# Patient Record
Sex: Male | Born: 1985 | Race: Black or African American | Hispanic: No | Smoking: Never smoker
Health system: Southern US, Community
[De-identification: ages and names within clinical notes are randomized; demographics above are authoritative.]

## PROBLEM LIST (undated history)

## (undated) DIAGNOSIS — J339 Nasal polyp, unspecified: Secondary | ICD-10-CM

## (undated) HISTORY — PX: NASAL POLYP EXCISION: SHX2068

---

## 2018-02-02 ENCOUNTER — Encounter (HOSPITAL_COMMUNITY): Payer: Self-pay | Admitting: Emergency Medicine

## 2018-02-02 ENCOUNTER — Emergency Department (HOSPITAL_COMMUNITY)
Admission: EM | Admit: 2018-02-02 | Discharge: 2018-02-02 | Disposition: A | Discharge status: Home / Self Care | Payer: Self-pay | Attending: Emergency Medicine | Admitting: Emergency Medicine

## 2018-02-02 ENCOUNTER — Other Ambulatory Visit: Payer: Self-pay

## 2018-02-02 ENCOUNTER — Emergency Department (HOSPITAL_COMMUNITY): Payer: Self-pay

## 2018-02-02 DIAGNOSIS — J9801 Acute bronchospasm: Secondary | ICD-10-CM | POA: Insufficient documentation

## 2018-02-02 HISTORY — DX: Nasal polyp, unspecified: J33.9

## 2018-02-02 MED ORDER — ALBUTEROL SULFATE (2.5 MG/3ML) 0.083% IN NEBU
5.0000 mg | INHALATION_SOLUTION | Freq: Once | RESPIRATORY_TRACT | Status: AC
  Administered 2018-02-02: 5 mg via RESPIRATORY_TRACT
  Filled 2018-02-02: qty 6

## 2018-02-02 MED ORDER — ALBUTEROL SULFATE HFA 108 (90 BASE) MCG/ACT IN AERS
2.0000 | INHALATION_SPRAY | Freq: Once | RESPIRATORY_TRACT | Status: AC
  Administered 2018-02-02: 2 via RESPIRATORY_TRACT
  Filled 2018-02-02: qty 6.7

## 2018-02-02 NOTE — ED Triage Notes (Signed)
Pt comes in complaining of chest pressure and wheezing. Patient states it has been going on for the last two weeks with no improvement. Patient states tonight after work it got worse so he came to be seen. Patient does not complain of pain, but mid chest pressure.

## 2018-02-02 NOTE — ED Provider Notes (Signed)
TIME SEEN: 5:41 AM  CHIEF COMPLAINT: Chest tightness, shortness of breath, intermittent wheezing  HPI: Patient is a 32 year old male with history of obesity and childhood asthma who presents to the emergency department with 2 weeks of intermittent chest tightness, shortness of breath and wheezing.  No aggravating or relieving factors.  He does not have a nebulizer machine or inhalers at home.  No fever or cough.   No history of PE, DVT, exogenous estrogen use, recent fractures, surgery, trauma, hospitalization or prolonged travel. No lower extremity swelling or pain. No calf tenderness.  No history of coronary artery disease.  ROS: See HPI Constitutional: no fever  Eyes: no drainage  ENT: no runny nose   Cardiovascular:   chest pain  Resp:  SOB  GI: no vomiting GU: no dysuria Integumentary: no rash  Allergy: no hives  Musculoskeletal: no leg swelling  Neurological: no slurred speech ROS otherwise negative  PAST MEDICAL HISTORY/PAST SURGICAL HISTORY:  No past medical history on file.  MEDICATIONS:  Prior to Admission medications   Not on File    ALLERGIES:  No Known Allergies  SOCIAL HISTORY:  Social History   Tobacco Use  . Smoking status: Not on file  Substance Use Topics  . Alcohol use: Not on file    FAMILY HISTORY: No family history on file.  EXAM: BP 129/75 (BP Location: Left Arm)   Pulse 88   Temp 98.6 F (37 C) (Oral)   Resp 16   Ht 6\' 4"  (1.93 m)   Wt (!) 168.3 kg (371 lb)   SpO2 100%   BMI 45.16 kg/m  CONSTITUTIONAL: Alert and oriented and responds appropriately to questions. Well-appearing; well-nourished HEAD: Normocephalic EYES: Conjunctivae clear, pupils appear equal, EOMI ENT: normal nose; moist mucous membranes NECK: Supple, no meningismus, no nuchal rigidity, no LAD  CARD: RRR; S1 and S2 appreciated; no murmurs, no clicks, no rubs, no gallops RESP: Normal chest excursion without splinting or tachypnea; breath sounds clear and equal  bilaterally; no wheezes, no rhonchi, no rales, no hypoxia or respiratory distress, speaking full sentences ABD/GI: Normal bowel sounds; non-distended; soft, non-tender, no rebound, no guarding, no peritoneal signs, no hepatosplenomegaly BACK:  The back appears normal and is non-tender to palpation, there is no CVA tenderness EXT: Normal ROM in all joints; non-tender to palpation; no edema; normal capillary refill; no cyanosis, no calf tenderness or swelling    SKIN: Normal color for age and race; warm; no rash NEURO: Moves all extremities equally PSYCH: The patient's mood and manner are appropriate. Grooming and personal hygiene are appropriate.  MEDICAL DECISION MAKING: Patient here with atypical chest pain.  Doubt ACS, PE, dissection.  He has no risk factors for pulmonary embolus.  EKG shows no ischemic abnormality.  Suspect this could be bronchospasm.  He is not currently wheezing.  Will treat with albuterol and reassess.  Will obtain chest x-ray.  ED PROGRESS: Chest x-ray clear.  No pneumonia or volume overload.  No pneumothorax.  Patient reports symptoms completely resolved after albuterol.  Will discharge with inhaler.  Again suspect bronchospasm.  I do not feel he needs antibiotics or steroids at this time.  Will give outpatient follow-up.  Discussed return precautions.  At this time, I do not feel there is any life-threatening condition present. I have reviewed and discussed all results (EKG, imaging, lab, urine as appropriate) and exam findings with patient/family. I have reviewed nursing notes and appropriate previous records.  I feel the patient is safe to be discharged  home without further emergent workup and can continue workup as an outpatient as needed. Discussed usual and customary return precautions. Patient/family verbalize understanding and are comfortable with this plan.  Outpatient follow-up has been provided if needed. All questions have been answered.    EKG  Interpretation  Date/Time:  Saturday February 02 2018 05:38:16 EDT Ventricular Rate:  88 PR Interval:    QRS Duration: 93 QT Interval:  352 QTC Calculation: 426 R Axis:   14 Text Interpretation:  Sinus rhythm Low voltage, precordial leads No old tracing to compare Confirmed by Deanda Ruddell, Baxter HireKristen [phone]([id number]) on 02/02/2018 5:42:05 AM         Milina Pagett, Layla MawKristen N, DO 02/02/18 [phone]

## 2018-02-02 NOTE — Discharge Instructions (Signed)
You may use your albuterol 2-4 puffs every 2-4 hours as needed for shortness of breath and wheezing.   To find a primary care or specialty doctor please call [phone] or [phone] to access "[facility] Find a Doctor Service."  You may also go on the [facility] website at [email or web address]  There are also multiple [facility], [facility], [facility] and [facility] practices throughout the [place] that are frequently accepting new patients. You may find a clinic that is close to your home and contact them.  [facility] -  [address] [place] [id number] [phone]   [facility] -  [address] [place] [address] [phone]   [facility] [phone]  [place] [phone]

## 2018-02-03 ENCOUNTER — Emergency Department (HOSPITAL_COMMUNITY)
Admission: EM | Admit: 2018-02-03 | Discharge: 2018-02-03 | Disposition: A | Discharge status: Home / Self Care | Payer: Self-pay | Attending: Emergency Medicine | Admitting: Emergency Medicine

## 2018-02-03 ENCOUNTER — Emergency Department (HOSPITAL_COMMUNITY): Payer: Self-pay

## 2018-02-03 ENCOUNTER — Encounter (HOSPITAL_COMMUNITY): Payer: Self-pay | Admitting: Emergency Medicine

## 2018-02-03 DIAGNOSIS — R079 Chest pain, unspecified: Secondary | ICD-10-CM | POA: Insufficient documentation

## 2018-02-03 DIAGNOSIS — I309 Acute pericarditis, unspecified: Secondary | ICD-10-CM | POA: Insufficient documentation

## 2018-02-03 LAB — BASIC METABOLIC PANEL
Anion gap: 8 (ref 5–15)
BUN: 12 mg/dL (ref 6–20)
CO2: 27 mmol/L (ref 22–32)
CREATININE: 0.99 mg/dL (ref 0.61–1.24)
Calcium: 9.1 mg/dL (ref 8.9–10.3)
Chloride: 103 mmol/L (ref 101–111)
GFR calc non Af Amer: >60 mL/min (ref >60)
Glucose, Bld: 211 mg/dL — ABNORMAL HIGH (ref 65–99)
Potassium: 3.5 mmol/L (ref 3.5–5.1)
Sodium: 138 mmol/L (ref 135–145)

## 2018-02-03 LAB — TROPONIN I

## 2018-02-03 LAB — CBC
HEMATOCRIT: 42.8 % (ref 39.0–52.0)
Hemoglobin: 14.2 g/dL (ref 13.0–17.0)
MCH: 28.4 pg (ref 26.0–34.0)
MCHC: 33.2 g/dL (ref 30.0–36.0)
MCV: 85.6 fL (ref 78.0–100.0)
Platelets: 346 10*3/uL (ref 150–400)
RBC: 5 MIL/uL (ref 4.22–5.81)
RDW: 13.5 % (ref 11.5–15.5)
WBC: 7.2 10*3/uL (ref 4.0–10.5)

## 2018-02-03 LAB — D-DIMER, QUANTITATIVE: D-Dimer, Quant: <0.27 ug/mL-FEU (ref 0.00–0.50)

## 2018-02-03 LAB — CBG MONITORING, ED: Glucose-Capillary: 205 mg/dL — ABNORMAL HIGH (ref 65–99)

## 2018-02-03 LAB — I-STAT TROPONIN, ED: TROPONIN I, POC: 0 ng/mL (ref 0.00–0.08)

## 2018-02-03 MED ORDER — ASPIRIN 81 MG PO CHEW
324.0000 mg | CHEWABLE_TABLET | Freq: Once | ORAL | Status: AC
  Administered 2018-02-03: 324 mg via ORAL
  Filled 2018-02-03: qty 4

## 2018-02-03 MED ORDER — NITROGLYCERIN 0.4 MG SL SUBL
0.4000 mg | SUBLINGUAL_TABLET | SUBLINGUAL | Status: DC | PRN
  Administered 2018-02-03 (×2): 0.4 mg via SUBLINGUAL
  Filled 2018-02-03 (×2): qty 1

## 2018-02-03 MED ORDER — SODIUM CHLORIDE 0.9 % IV BOLUS
500.0000 mL | Freq: Once | INTRAVENOUS | Status: AC
  Administered 2018-02-03: 500 mL via INTRAVENOUS

## 2018-02-03 MED ORDER — COLCHICINE 0.6 MG PO TABS: 0.6000 mg | ORAL_TABLET | Freq: Every day | ORAL | 0 refills | Status: DC

## 2018-02-03 NOTE — Discharge Instructions (Addendum)
Please take ibuprofen 600 mg three times per day Please follow up with your primary doctor, if you do not have a primary care doctor , please call the number on the discharge instructions and find a primary care doctor.

## 2018-02-03 NOTE — ED Provider Notes (Signed)
[facility]-EMERGENCY DEPT Provider Note   CSN: [id number] Arrival date & time: 02/03/18  1148     History   Chief Complaint Chief Complaint  Patient presents with  . Shortness of Breath  . Chest Pain    HPI Itai Barbian is a 32 y.o. male.  HPI  32 y.o male chest pain front x 1 month worse for past two weeks and constant, pain is sharp, currently 8/10 and has tried mucinex, gas relief, and ibuprofen without relief.  Patient has coughed up clear sputum.  No fever, some chills, nausea, vomiting or diarrhea.  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  Denies leg swelling, but states some feet swelling.  Using albuterol without relief.    Past Medical History:  Diagnosis Date  . Nasal polyp     There are no active problems to display for this patient.   Past Surgical History:  Procedure Laterality Date  . NASAL POLYP EXCISION          Home Medications    Prior to Admission medications   Not on File    Family History History reviewed. No pertinent family history.  Social History Social History   Tobacco Use  . Smoking status: Never Smoker  . Smokeless tobacco: Never Used  Substance Use Topics  . Alcohol use: Yes    Comment: Occasional   . Drug use: Not Currently     Allergies   Patient has no known allergies.   Review of Systems Review of Systems  Constitutional: Positive for chills. Negative for fever.  HENT: Positive for sinus pressure. Negative for congestion, rhinorrhea, sinus pain, sneezing and sore throat.   Eyes: Positive for pain.  Respiratory: Positive for cough and shortness of breath.   Cardiovascular: Positive for chest pain and leg swelling.  Gastrointestinal: Negative.   Endocrine: Negative.   Genitourinary: Negative.   Musculoskeletal: Negative.   Skin: Negative.   Neurological: Negative.   Psychiatric/Behavioral: Negative.   All other systems reviewed and are negative.    Physical  Exam Updated Vital Signs BP 117/69 (BP Location: Left Arm)   Pulse (!) 105   Temp 98.6 F (37 C) (Oral)   Resp (!) 21   Ht 1.93 m (6\' 4" )   Wt (!) 168.3 kg (371 lb)   SpO2 97%   BMI 45.16 kg/m   Physical Exam  Constitutional: He is oriented to person, place, and time. He appears well-developed and well-nourished.  Morbidly obese  HENT:  Head: Normocephalic and atraumatic.  Mouth/Throat: Oropharynx is clear and moist.  Eyes: Pupils are equal, round, and reactive to light. EOM are normal.  Neck: Normal range of motion. Neck supple.  Cardiovascular: Tachycardia present.  Pulmonary/Chest: Effort normal and breath sounds normal.  Abdominal: Soft. Normal appearance.  Musculoskeletal: Normal range of motion.  Neurological: He is alert and oriented to person, place, and time.  Skin: Skin is warm and dry.  Psychiatric: His speech is normal and behavior is normal. Judgment and thought content normal. Cognition and memory are normal.  Nursing note and vitals reviewed.    ED Treatments / Results  Labs (all labs ordered are listed, but only abnormal results are displayed) Labs Reviewed - No data to display  EKG EKG Interpretation  Date/Time:  Sunday February 03 2018 12:56:38 EDT Ventricular Rate:  105 PR Interval:    QRS Duration: 91 QT Interval:  331 QTC Calculation: 438 R Axis:   17 Text Interpretation:  Sinus tachycardia ST  elev, probable normal early repol pattern st elevation v2 and v3 increased from yesterday Confirmed by Margarita Grizzleay, Jomaira Darr [phone]([id number]) on 02/03/2018 1:46:28 PM   Radiology Dg Chest 2 View  Result Date: 02/02/2018 CLINICAL DATA:  32 y/o  M; chest pressure and wheezing. EXAM: CHEST - 2 VIEW COMPARISON:  None. FINDINGS: The heart size and mediastinal contours are within normal limits. Both lungs are clear. The visualized skeletal structures are unremarkable. IMPRESSION: No active cardiopulmonary disease. Electronically Signed   By: Mitzi HansenLance  Furusawa-Stratton M.D.   On:  02/02/2018 06:33    Procedures Procedures (including critical care time)  Medications Ordered in ED Medications - No data to display   Initial Impression / Assessment and Plan / ED Course  I have reviewed the triage vital signs and the nursing notes.  Pertinent labs & imaging results that were available during my care of the patient were reviewed by me and considered in my medical decision making (see chart for details).     32 year old male presents today with one-month history of chest pain which is worsened over the past 2 weeks.  He denies any fever or chills.  He does have some dyspnea.  On evaluation his lungs appear clear.  Differential diagnosis includes PE, but negative for lateralized swelling and d-dimer is negative.  Patient with diffuse ST elevation on EKG.  Discussed with Dr. Sharyn Lullharwani, on cardiology.  He feels EKG is most consistent with pericarditis.  Plan repeat troponin.  If continues negative, will discharge to home with treatment for pericarditis. Final Clinical Impressions(s) / ED Diagnoses   Final diagnoses:  Chest pain, unspecified type  Acute pericarditis, unspecified type    ED Discharge Orders        Ordered    colchicine 0.6 MG tablet  Daily     02/03/18 1434       Margarita Grizzleay, Eaton Folmar, MD 02/06/18 [phone]

## 2018-02-03 NOTE — ED Triage Notes (Signed)
Pt c/o chest pain and SOB. Was seen yesterday with Rx for albuterol inhaler which he states is not working. He is feeling much worse today.

## 2018-06-21 ENCOUNTER — Emergency Department (HOSPITAL_COMMUNITY): Payer: Self-pay

## 2018-06-21 ENCOUNTER — Other Ambulatory Visit: Payer: Self-pay

## 2018-06-21 ENCOUNTER — Encounter (HOSPITAL_COMMUNITY): Payer: Self-pay

## 2018-06-21 ENCOUNTER — Emergency Department (HOSPITAL_COMMUNITY)
Admission: EM | Admit: 2018-06-21 | Discharge: 2018-06-21 | Disposition: A | Discharge status: Home / Self Care | Payer: Self-pay | Attending: Emergency Medicine | Admitting: Emergency Medicine

## 2018-06-21 DIAGNOSIS — J40 Bronchitis, not specified as acute or chronic: Secondary | ICD-10-CM | POA: Insufficient documentation

## 2018-06-21 DIAGNOSIS — Z79899 Other long term (current) drug therapy: Secondary | ICD-10-CM | POA: Insufficient documentation

## 2018-06-21 MED ORDER — PREDNISONE 50 MG PO TABS: ORAL_TABLET | ORAL | 0 refills | Status: DC

## 2018-06-21 MED ORDER — AZITHROMYCIN 250 MG PO TABS: 250.0000 mg | ORAL_TABLET | Freq: Every day | ORAL | 0 refills | Status: DC

## 2018-06-21 MED ORDER — PROMETHAZINE-PHENYLEPHRINE 6.25-5 MG/5ML PO SYRP: 5.0000 mL | ORAL_SOLUTION | ORAL | 0 refills | Status: DC | PRN

## 2018-06-21 MED ORDER — ALBUTEROL SULFATE HFA 108 (90 BASE) MCG/ACT IN AERS
2.0000 | INHALATION_SPRAY | RESPIRATORY_TRACT | Status: DC | PRN
  Administered 2018-06-21: 2 via RESPIRATORY_TRACT
  Filled 2018-06-21: qty 6.7

## 2018-06-21 NOTE — ED Provider Notes (Signed)
[facility]-EMERGENC[facility] DEPT Provider Note   CSN: [id number] Arrival date & time: 06/21/18  1713     History   Chief Complaint Chief Complaint  Patient presents with  . Cough    HPI Scott Li is a 32 y.o. male who presents to the ED with a cough. Patient reports that the cough has been going on for about 4 weeks. 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 Patient has been taking OTC medications and Tessalon that he got here several weeks ago.   HPI  Past Medical History:  Diagnosis Date  . Nasal polyp     There are no active problems to display for this patient.   Past Surgical History:  Procedure Laterality Date  . NASAL POLYP EXCISION          Home Medications    Prior to Admission medications   Medication Sig Start Date End Date Taking? Authorizing Provider  azithromycin (ZITHROMAX) 250 MG tablet Take 1 tablet (250 mg total) by mouth daily. Take first 2 tablets together, then 1 every day until finished. 06/21/18   Janne NapoleonNeese, Hope M, NP  colchicine 0.6 MG tablet Take 1 tablet (0.6 mg total) by mouth daily. 02/03/18   Margarita Grizzleay, Danielle, MD  multivitamin (ONE-A-DAY MEN'S) TABS tablet Take 1 tablet by mouth daily.    [provider]  predniSONE (DELTASONE) 50 MG tablet Take one tablet PO daily 06/21/18   Kerrie BuffaloNeese, Hope M, NP  promethazine-phenylephrine (PROMETHAZINE VC) 6.25-5 MG/5ML SYRP Take 5 mLs by mouth every 4 (four) hours as needed for congestion. 06/21/18   Janne NapoleonNeese, Hope M, NP    Family History No family history on file.  Social History Social History   Tobacco Use  . Smoking status: Never Smoker  . Smokeless tobacco: Never Used  Substance Use Topics  . Alcohol use: Yes    Comment: Occasional   . Drug use: Not Currently     Allergies   Patient has no known allergies.   Review of Systems Review of Systems  HENT: Positive for congestion and sore  throat.   Respiratory: Positive for cough and wheezing.   All other systems reviewed and are negative.    Physical Exam Updated Vital Signs BP 138/87 (BP Location: Left Arm)   Pulse 84   Temp 99 F (37.2 C) (Oral)   Resp 18   Ht 6\' 4"  (1.93 m)   Wt (!) 150.7 kg   SpO2 96%   BMI 40.44 kg/m   Physical Exam  Constitutional: He appears well-developed and well-nourished. No distress.  HENT:  Head: Normocephalic and atraumatic.  Right Ear: Tympanic membrane normal.  Left Ear: Tympanic membrane normal.  Nose: Mucosal edema and rhinorrhea present.  Mouth/Throat: Mucous membranes are normal. Posterior oropharyngeal erythema present. No oropharyngeal exudate or posterior oropharyngeal edema.  Eyes: EOM are normal.  Neck: Neck supple.  Cardiovascular: Normal rate and regular rhythm.  Pulmonary/Chest: Effort normal. No respiratory distress. He has wheezes. He has no rales.  Occasional expiratory wheezing heard  Musculoskeletal: Normal range of motion.  Neurological: He is alert.  Skin: Skin is warm and dry.  Psychiatric: He has a normal mood and affect. His behavior is normal.  Nursing note and vitals reviewed.    ED Treatments / Results  Labs (all labs ordered are listed, but only abnormal results are displayed) Labs Reviewed - No data to display  Radiology  Dg Chest 2 View  Result Date: 06/21/2018 CLINICAL DATA:  Cough EXAM: CHEST - 2 VIEW COMPARISON:  February 03, 2018 FINDINGS: Lungs are clear. Heart size and pulmonary vascularity are normal. No adenopathy. No pneumothorax. No bone lesions. IMPRESSION: No edema or consolidation. Electronically Signed   By: Bretta BangWilliam  Woodruff III M.D.   On: 06/21/2018 18:27    Procedures Procedures (including critical care time)  Medications Ordered in ED Medications  albuterol (PROVENTIL HFA;VENTOLIN HFA) 108 (90 Base) MCG/ACT inhaler 2 puff (has no administration in time range)     Initial Impression / Assessment and Plan / ED Course    I have reviewed the triage vital signs and the nursing notes. Pt CXR negative for acute infiltrate. Patient's symptoms are consistent with bronchitis with bronchospasm. Since patient has had persistent symptoms and not responding will treat with z-pak, albuterol inhaler, cough medication and f/u given [facility]. Patient verbalizes understanding and is agreeable with plan. Pt is hemodynamically stable & in NAD prior to dc.  Final Clinical Impressions(s) / ED Diagnoses   Final diagnoses:  Bronchitis    ED Discharge Orders         Ordered    predniSONE (DELTASONE) 50 MG tablet     06/21/18 1857    promethazine-phenylephrine (PROMETHAZINE VC) 6.25-5 MG/5ML SYRP  Every 4 hours PRN     06/21/18 1857    azithromycin (ZITHROMAX) 250 MG tablet  Daily     06/21/18 1857           Kerrie Buffaloeese, Hope BradleyM, NP 06/21/18 1908    Sabas SousBero, Michael M, MD 06/22/18 [phone]

## 2018-06-21 NOTE — ED Triage Notes (Signed)
Pt is from home. Pt is AOx4 and Ambulatory. Pt chief complaint is cough that has been going on for about 4 weeks. 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

## 2018-06-21 NOTE — Discharge Instructions (Addendum)
Follow up with [facility]. Call to schedule an appointment. You can take Claritin for allergies. Use the inhaler 2 puffs every 4 hours as needed for cough and wheezing.

## 2018-11-09 ENCOUNTER — Emergency Department (HOSPITAL_COMMUNITY)
Admission: EM | Admit: 2018-11-09 | Discharge: 2018-11-09 | Disposition: A | Discharge status: Home / Self Care | Payer: 59 | Attending: Emergency Medicine | Admitting: Emergency Medicine

## 2018-11-09 ENCOUNTER — Encounter (HOSPITAL_COMMUNITY): Payer: Self-pay

## 2018-11-09 ENCOUNTER — Other Ambulatory Visit: Payer: Self-pay

## 2018-11-09 DIAGNOSIS — J209 Acute bronchitis, unspecified: Secondary | ICD-10-CM | POA: Diagnosis not present

## 2018-11-09 DIAGNOSIS — J029 Acute pharyngitis, unspecified: Secondary | ICD-10-CM | POA: Diagnosis present

## 2018-11-09 DIAGNOSIS — J40 Bronchitis, not specified as acute or chronic: Secondary | ICD-10-CM

## 2018-11-09 DIAGNOSIS — Z79899 Other long term (current) drug therapy: Secondary | ICD-10-CM | POA: Diagnosis not present

## 2018-11-09 LAB — INFLUENZA PANEL BY PCR (TYPE A & B)
Influenza A By PCR: NEGATIVE
Influenza B By PCR: NEGATIVE

## 2018-11-09 MED ORDER — PREDNISONE 10 MG PO TABS: 40.0000 mg | ORAL_TABLET | Freq: Every day | ORAL | 0 refills | Status: DC

## 2018-11-09 MED ORDER — BENZONATATE 100 MG PO CAPS: 100.0000 mg | ORAL_CAPSULE | Freq: Three times a day (TID) | ORAL | 0 refills | Status: DC

## 2018-11-09 MED ORDER — IPRATROPIUM-ALBUTEROL 0.5-2.5 (3) MG/3ML IN SOLN
3.0000 mL | Freq: Once | RESPIRATORY_TRACT | Status: AC
  Administered 2018-11-09: 3 mL via RESPIRATORY_TRACT
  Filled 2018-11-09: qty 3

## 2018-11-09 MED ORDER — ALBUTEROL SULFATE HFA 108 (90 BASE) MCG/ACT IN AERS: 1.0000 | INHALATION_SPRAY | Freq: Four times a day (QID) | RESPIRATORY_TRACT | 0 refills | Status: DC | PRN

## 2018-11-09 MED ORDER — AZITHROMYCIN 250 MG PO TABS: 250.0000 mg | ORAL_TABLET | Freq: Every day | ORAL | 0 refills | Status: DC

## 2018-11-09 NOTE — ED Notes (Signed)
Pt refused strept screen until provider saw him

## 2018-11-09 NOTE — Discharge Instructions (Addendum)
Take the medication as directed. Follow up with your doctor or return here as needed.  °

## 2018-11-09 NOTE — ED Provider Notes (Signed)
[facility]EM[facility]PT Provider Note   CSN: [id number] Arrival date & time: 11/09/18  1121     History   Chief Complaint Chief Complaint  Patient presents with  . Cough  . Sore Throat    HPI Scott Li is a 33 y.o. male who presents to the ED with c/o sore throat and URI symptoms. The symptoms started 2 days ago. Patient reports family has been in town and had similar symptoms. Patient states that after family left he woke with sweats and chills, cough and wheezing. Patient has had inhaler in the past but ran out.   The history is provided by the patient. No language interpreter was used.  Influenza  Presenting symptoms: cough, fatigue, fever, myalgias and sore throat   Presenting symptoms: no diarrhea, no headaches, no nausea, no shortness of breath and no vomiting   Onset quality:  Gradual Duration:  2 days Progression:  Worsening Chronicity:  New Relieved by:  Nothing Worsened by:  Nothing Associated symptoms: chills and nasal congestion   Associated symptoms: no decreased appetite, no decrease in physical activity, no ear pain, no neck stiffness and no syncope   Risk factors: sick contacts     Past Medical History:  Diagnosis Date  . Nasal polyp     There are no active problems to display for this patient.   Past Surgical History:  Procedure Laterality Date  . NASAL POLYP EXCISION          Home Medications    Prior to Admission medications   Medication Sig Start Date End Date Taking? Authorizing Provider  albuterol (PROVENTIL HFA;VENTOLIN HFA) 108 (90 Base) MCG/ACT inhaler Inhale 1-2 puffs into the lungs every 6 (six) hours as needed for wheezing or shortness of breath. 11/09/18   Janne NapoleonNeese, Hope M, NP  azithromycin (ZITHROMAX) 250 MG tablet Take 1 tablet (250 mg total) by mouth daily. Take first 2 tablets together, then 1 every day until finished. 11/09/18   Janne NapoleonNeese, Hope M, NP  benzonatate (TESSALON) 100 MG capsule Take 1 capsule (100 mg  total) by mouth every 8 (eight) hours. 11/09/18   Janne NapoleonNeese, Hope M, NP  colchicine 0.6 MG tablet Take 1 tablet (0.6 mg total) by mouth daily. 02/03/18   Margarita Grizzleay, Danielle, MD  multivitamin (ONE-A-DAY MEN'S) TABS tablet Take 1 tablet by mouth daily.    [provider]  predniSONE (DELTASONE) 10 MG tablet Take 4 tablets (40 mg total) by mouth daily with breakfast. 11/09/18   Janne NapoleonNeese, Hope M, NP  promethazine-phenylephrine (PROMETHAZINE VC) 6.25-5 MG/5ML SYRP Take 5 mLs by mouth every 4 (four) hours as needed for congestion. 06/21/18   Janne NapoleonNeese, Hope M, NP    Family History History reviewed. No pertinent family history.  Social History Social History   Tobacco Use  . Smoking status: Never Smoker  . Smokeless tobacco: Never Used  Substance Use Topics  . Alcohol use: Yes    Comment: Occasional   . Drug use: Not Currently     Allergies   Patient has no known allergies.   Review of Systems Review of Systems  Constitutional: Positive for chills, fatigue and fever. Negative for decreased appetite.  HENT: Positive for congestion and sore throat. Negative for ear pain, mouth sores and trouble swallowing.   Eyes: Negative for discharge, redness and itching.  Respiratory: Positive for cough and wheezing. Negative for shortness of breath.   Cardiovascular: Negative for chest pain.  Gastrointestinal: Negative for abdominal pain, diarrhea, nausea and vomiting.  Genitourinary: Negative for dysuria, frequency and urgency.  Musculoskeletal: Positive for myalgias. Negative for back pain and neck stiffness.  Skin: Negative for rash.  Neurological: Negative for headaches.  Hematological: Negative for adenopathy.  Psychiatric/Behavioral: Negative for confusion. The patient is not nervous/anxious.      Physical Exam Updated Vital Signs BP (!) 147/92 (BP Location: Left Arm)   Pulse 88   Temp 99.1 F (37.3 C) (Oral)   Resp 16   Ht 6\' 4"  (1.93 m)   Wt (!) 147.4 kg   SpO2 98%   BMI 39.56 kg/m    Physical Exam Vitals signs and nursing note reviewed.  Constitutional:      General: He is not in acute distress.    Appearance: He is well-developed.  HENT:     Head: Normocephalic and atraumatic.     Right Ear: Tympanic membrane is erythematous.     Left Ear: Tympanic membrane normal.     Nose: Congestion present.     Mouth/Throat:     Mouth: Mucous membranes are moist. No oral lesions.     Pharynx: Uvula midline. Posterior oropharyngeal erythema present. No oropharyngeal exudate.  Eyes:     Extraocular Movements:     Right eye: Normal extraocular motion.     Conjunctiva/sclera: Conjunctivae normal.     Pupils: Pupils are equal, round, and reactive to light.  Neck:     Musculoskeletal: Normal range of motion and neck supple.  Cardiovascular:     Rate and Rhythm: Normal rate and regular rhythm.  Pulmonary:     Effort: Pulmonary effort is normal. No respiratory distress.     Breath sounds: Decreased air movement present. No rales. Wheezes: occasional.  Abdominal:     Palpations: Abdomen is soft.     Tenderness: There is no abdominal tenderness.  Musculoskeletal: Normal range of motion.  Lymphadenopathy:     Cervical: No cervical adenopathy.  Skin:    General: Skin is warm and dry.  Neurological:     Mental Status: He is alert and oriented to person, place, and time.     Cranial Nerves: No cranial nerve deficit.  Psychiatric:        Mood and Affect: Mood normal.      ED Treatments / Results  Labs (all labs ordered are listed, but only abnormal results are displayed) Labs Reviewed  INFLUENZA PANEL BY PCR (TYPE A & B)    Radiology No results found.  Procedures Procedures (including critical care time)  Medications Ordered in ED Medications  ipratropium-albuterol (DUONEB) 0.5-2.5 (3) MG/3ML nebulizer solution 3 mL (3 mLs Nebulization Given 11/09/18 1359)     Initial Impression / Assessment and Plan / ED Course  I have reviewed the triage vital signs and the  nursing notes.. Patients symptoms are consistent with bronchitis. Pt will be discharged with Z-pak , albuterol, tessalon and prednisone.   Verbalizes understanding and is agreeable with plan. Pt is hemodynamically stable & in NAD prior to dc.  Final Clinical Impressions(s) / ED Diagnoses   Final diagnoses:  Bronchitis    ED Discharge Orders         Ordered    benzonatate (TESSALON) 100 MG capsule  Every 8 hours     11/09/18 1546    azithromycin (ZITHROMAX) 250 MG tablet  Daily     11/09/18 1546    predniSONE (DELTASONE) 10 MG tablet  Daily with breakfast     11/09/18 1546    albuterol (PROVENTIL HFA;VENTOLIN HFA) 108 (90  Base) MCG/ACT inhaler  Every 6 hours PRN     11/09/18 1546           Kerrie Buffalo [place], [place] 11/10/18 2150    Rolan Bucco, MD 11/19/18 [phone]

## 2018-11-09 NOTE — ED Triage Notes (Signed)
Pt reports that he has had a productive cough and "scratchy throat" for 2 days. Pt reports that family was just in and had similar symptoms.

## 2019-01-09 ENCOUNTER — Other Ambulatory Visit: Payer: Self-pay

## 2019-01-09 ENCOUNTER — Encounter: Payer: Self-pay | Admitting: Emergency Medicine

## 2019-01-09 ENCOUNTER — Emergency Department: Payer: 59

## 2019-01-09 ENCOUNTER — Emergency Department
Admission: EM | Admit: 2019-01-09 | Discharge: 2019-01-09 | Disposition: A | Discharge status: Home / Self Care | Payer: 59 | Attending: Emergency Medicine | Admitting: Emergency Medicine

## 2019-01-09 DIAGNOSIS — R0602 Shortness of breath: Secondary | ICD-10-CM | POA: Diagnosis present

## 2019-01-09 DIAGNOSIS — J40 Bronchitis, not specified as acute or chronic: Secondary | ICD-10-CM | POA: Insufficient documentation

## 2019-01-09 DIAGNOSIS — Z79899 Other long term (current) drug therapy: Secondary | ICD-10-CM | POA: Insufficient documentation

## 2019-01-09 DIAGNOSIS — R079 Chest pain, unspecified: Secondary | ICD-10-CM | POA: Insufficient documentation

## 2019-01-09 LAB — CBC WITH DIFFERENTIAL/PLATELET
Abs Immature Granulocytes: 0.02 10*3/uL (ref 0.00–0.07)
Basophils Absolute: 0.1 10*3/uL (ref 0.0–0.1)
Basophils Relative: 1 %
Eosinophils Absolute: 0.3 10*3/uL (ref 0.0–0.5)
Eosinophils Relative: 5 %
HCT: 44.2 % (ref 39.0–52.0)
Hemoglobin: 14.6 g/dL (ref 13.0–17.0)
Immature Granulocytes: 0 %
Lymphocytes Relative: 40 %
Lymphs Abs: 2.4 10*3/uL (ref 0.7–4.0)
MCH: 28.3 pg (ref 26.0–34.0)
MCHC: 33 g/dL (ref 30.0–36.0)
MCV: 85.7 fL (ref 80.0–100.0)
MONO ABS: 0.6 10*3/uL (ref 0.1–1.0)
Monocytes Relative: 10 %
Neutro Abs: 2.7 10*3/uL (ref 1.7–7.7)
Neutrophils Relative %: 44 %
Platelets: 342 10*3/uL (ref 150–400)
RBC: 5.16 MIL/uL (ref 4.22–5.81)
RDW: 13 % (ref 11.5–15.5)
WBC: 6 10*3/uL (ref 4.0–10.5)
nRBC: 0 % (ref 0.0–0.2)

## 2019-01-09 LAB — BASIC METABOLIC PANEL
Anion gap: 4 — ABNORMAL LOW (ref 5–15)
BUN: 10 mg/dL (ref 6–20)
CO2: 27 mmol/L (ref 22–32)
Calcium: 8.9 mg/dL (ref 8.9–10.3)
Chloride: 105 mmol/L (ref 98–111)
Creatinine, Ser: 0.85 mg/dL (ref 0.61–1.24)
GFR calc Af Amer: >60 mL/min (ref >60)
GLUCOSE: 116 mg/dL — AB (ref 70–99)
Potassium: 4.2 mmol/L (ref 3.5–5.1)
Sodium: 136 mmol/L (ref 135–145)

## 2019-01-09 LAB — TROPONIN I
Troponin I: <0.03 ng/mL (ref <0.03)
Troponin I: <0.03 ng/mL (ref <0.03)

## 2019-01-09 MED ORDER — PREDNISONE 20 MG PO TABS
60.0000 mg | ORAL_TABLET | Freq: Once | ORAL | Status: AC
  Administered 2019-01-09: 60 mg via ORAL
  Filled 2019-01-09: qty 3

## 2019-01-09 MED ORDER — IPRATROPIUM-ALBUTEROL 0.5-2.5 (3) MG/3ML IN SOLN
3.0000 mL | Freq: Once | RESPIRATORY_TRACT | Status: AC
  Administered 2019-01-09: 3 mL via RESPIRATORY_TRACT
  Filled 2019-01-09: qty 3

## 2019-01-09 MED ORDER — IPRATROPIUM-ALBUTEROL 0.5-2.5 (3) MG/3ML IN SOLN
3.0000 mL | Freq: Once | RESPIRATORY_TRACT | Status: AC
  Administered 2019-01-09: 3 mL via RESPIRATORY_TRACT
  Filled 2019-01-09: qty 6

## 2019-01-09 MED ORDER — ALBUTEROL SULFATE HFA 108 (90 BASE) MCG/ACT IN AERS: 2.0000 | INHALATION_SPRAY | Freq: Four times a day (QID) | RESPIRATORY_TRACT | 2 refills | Status: DC | PRN

## 2019-01-09 MED ORDER — PREDNISONE 20 MG PO TABS: 60.0000 mg | ORAL_TABLET | Freq: Every day | ORAL | 0 refills | Status: AC

## 2019-01-09 NOTE — ED Notes (Signed)
Pt ambulatory to restroom with steady gait noted  

## 2019-01-09 NOTE — ED Triage Notes (Signed)
Patient ambulatory to triage with steady gait, without difficulty or distress noted; pt st since last night having pain beneath left breast accomp by [facility]; denies hx of same

## 2019-01-09 NOTE — ED Notes (Signed)
Pt reports chest pain to the left side at the bottom of the ribs towards the flank. Pt describes it as feeling like a horse kicked him. Pt is in no acutes distress and is watching TV at this time.

## 2019-01-09 NOTE — ED Provider Notes (Signed)
[facility] Emergency Department Provider Note  ____________________________________________  Time seen: Approximately 5:35 AM  I have reviewed the triage vital signs and the nursing notes.   HISTORY  Chief Complaint Chest Pain   HPI Scott Li is a 33 y.o. male with a history of bronchitis and sinusitis who presents for evaluation of chest pain and shortness of breath.  Patient reports that his symptoms started this evening.  The shortness of breath is mild and constant.  The chest pain is sharp located in the left side of his chest, intermittent, lasting minutes at a time.  He has had 2 episodes this evening of the chest pain.  No dizziness, nausea, vomiting.  He has had a mild dry cough.  No fever or chills.  Patient is not a smoker.  Denies family history or personal history of heart attacks, PE or DVT, recent travel immobilization, leg pain or swelling, hemoptysis, or exogenous hormones.  Past Medical History:  Diagnosis Date  . Nasal polyp     Past Surgical History:  Procedure Laterality Date  . NASAL POLYP EXCISION      Prior to Admission medications   Medication Sig Start Date End Date Taking? Authorizing Provider  albuterol (PROVENTIL HFA;VENTOLIN HFA) 108 (90 Base) MCG/ACT inhaler Inhale 2 puffs into the lungs every 6 (six) hours as needed for wheezing or shortness of breath. 01/09/19   Nita Sickle, MD  azithromycin (ZITHROMAX) 250 MG tablet Take 1 tablet (250 mg total) by mouth daily. Take first 2 tablets together, then 1 every day until finished. 11/09/18   Janne Napoleon, NP  benzonatate (TESSALON) 100 MG capsule Take 1 capsule (100 mg total) by mouth every 8 (eight) hours. 11/09/18   Janne Napoleon, NP  colchicine 0.6 MG tablet Take 1 tablet (0.6 mg total) by mouth daily. 02/03/18   Margarita Grizzle, MD  multivitamin (ONE-A-DAY MEN'S) TABS tablet Take 1 tablet by mouth daily.    [provider]  predniSONE (DELTASONE) 20 MG tablet Take  3 tablets (60 mg total) by mouth daily for 4 days. 01/09/19 01/13/19  Nita Sickle, MD  promethazine-phenylephrine (PROMETHAZINE VC) 6.25-5 MG/5ML SYRP Take 5 mLs by mouth every 4 (four) hours as needed for congestion. 06/21/18   Janne Napoleon, NP    Allergies Patient has no known allergies.  No family history on file.  Social History Social History   Tobacco Use  . Smoking status: Never Smoker  . Smokeless tobacco: Never Used  Substance Use Topics  . Alcohol use: Yes    Comment: Occasional   . Drug use: Not Currently    Review of Systems  Constitutional: Negative for fever. Eyes: Negative for visual changes. ENT: Negative for sore throat. Neck: No neck pain  Cardiovascular: + chest pain. Respiratory: + shortness of breath, cough Gastrointestinal: Negative for abdominal pain, vomiting or diarrhea. Genitourinary: Negative for dysuria. Musculoskeletal: Negative for back pain. Skin: Negative for rash. Neurological: Negative for headaches, weakness or numbness. Psych: No SI or HI  ____________________________________________   PHYSICAL EXAM:  VITAL SIGNS: ED Triage Vitals  Enc Vitals Group     BP 01/09/19 0533 133/81     Pulse Rate 01/09/19 0533 77     Resp 01/09/19 0533 18     Temp 01/09/19 0533 (!) 97.1 F (36.2 C)     Temp Source 01/09/19 0533 Oral     SpO2 01/09/19 0533 97 %     Weight 01/09/19 0523 (!) 345 lb (156.5  kg)     Height 01/09/19 0523 6\' 4"  (1.93 m)     Head Circumference --      Peak Flow --      Pain Score 01/09/19 0523 8     Pain Loc --      Pain Edu? --      Excl. in GC? --     Constitutional: Alert and oriented. Well appearing and in no apparent distress. HEENT:      Head: Normocephalic and atraumatic.         Eyes: Conjunctivae are normal. Sclera is non-icteric.       Mouth/Throat: Mucous membranes are moist.       Neck: Supple with no signs of meningismus. Cardiovascular: Regular rate and rhythm. No murmurs, gallops, or rubs. 2+  symmetrical distal pulses are present in all extremities. No JVD. Palpation of the left chest wall reproduces the pain Respiratory: Normal respiratory effort. Lungs are clear to auscultation bilaterally with decreased air movement bilaterally. No wheezes, crackles, or rhonchi.  Gastrointestinal: Obese, non tender, and non distended with positive bowel sounds. No rebound or guarding. Musculoskeletal: Nontender with normal range of motion in all extremities. No edema, cyanosis, or erythema of extremities. Neurologic: Normal speech and language. Face is symmetric. Moving all extremities. No gross focal neurologic deficits are appreciated. Skin: Skin is warm, dry and intact. No rash noted. Psychiatric: Mood and affect are normal. Speech and behavior are normal.  ____________________________________________   LABS (all labs ordered are listed, but only abnormal results are displayed)  Labs Reviewed  BASIC METABOLIC PANEL - Abnormal; Notable for the following components:      Result Value   Glucose, Bld 116 (*)    Anion gap 4 (*)    All other components within normal limits  CBC WITH DIFFERENTIAL/PLATELET  TROPONIN I  TROPONIN I   ____________________________________________  EKG  ED ECG REPORT I, Nita Sickle, the attending physician, personally viewed and interpreted this ECG.  Normal sinus rhythm, rate of 75, normal intervals, normal axis, no ST elevations or depressions.  Normal EKG. ____________________________________________  RADIOLOGY  I have personally reviewed the images performed during this visit and I agree with the Radiologist's read.   Interpretation by Radiologist:  Dg Chest 2 View  Result Date: 01/09/2019 CLINICAL DATA:  Chest pain EXAM: CHEST - 2 VIEW COMPARISON:  06/21/2018 FINDINGS: The heart size and mediastinal contours are within normal limits. Both lungs are clear. The visualized skeletal structures are unremarkable. IMPRESSION: No active cardiopulmonary  disease. Electronically Signed   By: Deatra Robinson M.D.   On: 01/09/2019 06:01      ____________________________________________   PROCEDURES  Procedure(s) performed: None Procedures Critical Care performed:  None ____________________________________________   INITIAL IMPRESSION / ASSESSMENT AND PLAN / ED COURSE  33 y.o. male with a history of bronchitis and sinusitis who presents for evaluation of chest pain and shortness of breath.  Patient with short-lived episodes of sharp left-sided chest pain which resolved with no intervention.  Heart score of 1.  PERC negative.  Will further stratify patient with a troponin x2.  Will get chest x-ray to rule out pulmonary edema although patient looks euvolemic, pneumothorax, pneumonia.  No fever or chills therefore flu is less likely.  Patient has very diminished air movement bilaterally but is in no respiratory distress.  Will treat with duo nebs. Possibly bronchitis.   Clinical Course as of Jan 08 634  Thu Jan 09, 2019  [id number] Patient reports shortness of breath  is improved after 2 DuoNeb's.  Remains with normal work of breathing and normal sats.  First troponin is negative.  Labs are no acute findings.  Chest x-ray normal.  Second troponin is due at 9 AM.  As long as that is negative will discharge patient home on prednisone and albuterol for possible bronchitis.  Will refer patient to cardiologist for further evaluation.  Plan discussed with the oncoming physician at 7 AM   [CV]    Clinical Course User Index [CV] Don Perking [place], MD     As part of my medical decision making, I reviewed the following data within the electronic MEDICAL RECORD NUMBER Nursing notes reviewed and incorporated, Labs reviewed , EKG interpreted , Old EKG reviewed, Old chart reviewed, Radiograph reviewed , Notes from prior ED visits and [place] Controlled Substance Database    Pertinent labs & imaging results that were available during my care of the patient were reviewed  by me and considered in my medical decision making (see chart for details).    ____________________________________________   FINAL CLINICAL IMPRESSION(S) / ED DIAGNOSES  Final diagnoses:  Chest pain, unspecified type  Bronchitis      NEW MEDICATIONS STARTED DURING THIS VISIT:  ED Discharge Orders         Ordered    predniSONE (DELTASONE) 20 MG tablet  Daily     01/09/19 0634    albuterol (PROVENTIL HFA;VENTOLIN HFA) 108 (90 Base) MCG/ACT inhaler  Every 6 hours PRN     01/09/19 0634           Note:  This document was prepared using Dragon voice recognition software and may include unintentional dictation errors.    Nita Sickle, MD 01/09/19 [phone]

## 2019-01-09 NOTE — Discharge Instructions (Addendum)

## 2019-02-03 ENCOUNTER — Telehealth: Payer: Self-pay | Admitting: *Deleted

## 2019-02-03 NOTE — Telephone Encounter (Signed)
   Primary Cardiologist: Dr Jacques Navy   Pt contacted.  History and symptoms reviewed.  Pt will f/u with [facility] provider as scheduled- vitual visit.   Patient will sign up for  [facility] AND INSTRUCTION WILL BE  SENT PRIOR TO APPOINTMENT.   RN SENT/ LINK TO SIGN UP  Doreen Salvage, RN  02/03/2019 10:46 AM

## 2019-02-04 ENCOUNTER — Other Ambulatory Visit: Payer: Self-pay

## 2019-02-04 ENCOUNTER — Telehealth: Payer: Self-pay | Admitting: Internal Medicine

## 2019-02-04 NOTE — Telephone Encounter (Signed)
LMTCB to do pre-call for upcoming visit with Dr. Jacques Navy on 02/06/19.

## 2019-02-05 NOTE — Telephone Encounter (Signed)
Attempted to call pt again. No answer. Left vm to call back.

## 2019-02-06 ENCOUNTER — Telehealth (INDEPENDENT_AMBULATORY_CARE_PROVIDER_SITE_OTHER): Payer: 59 | Admitting: Internal Medicine

## 2019-02-06 ENCOUNTER — Other Ambulatory Visit: Payer: Self-pay

## 2019-02-06 ENCOUNTER — Encounter: Payer: Self-pay | Admitting: Internal Medicine

## 2019-02-06 DIAGNOSIS — R0789 Other chest pain: Secondary | ICD-10-CM

## 2019-02-10 ENCOUNTER — Telehealth: Payer: Self-pay | Admitting: Internal Medicine

## 2019-02-10 NOTE — Telephone Encounter (Signed)
LVM to call back to do visit pre-call for appt with Dr. Jacques Navy on 02/13/19 at 10:40 AM.

## 2019-02-13 ENCOUNTER — Telehealth: Payer: 59 | Admitting: Internal Medicine

## 2019-02-16 NOTE — Progress Notes (Signed)
Patient did not answer phone to begin telehealth visit. He was encouraged to reschedule.

## 2019-07-01 ENCOUNTER — Emergency Department: Payer: 59

## 2019-07-01 ENCOUNTER — Other Ambulatory Visit: Payer: Self-pay

## 2019-07-01 ENCOUNTER — Encounter: Payer: Self-pay | Admitting: Emergency Medicine

## 2019-07-01 ENCOUNTER — Emergency Department
Admission: EM | Admit: 2019-07-01 | Discharge: 2019-07-01 | Disposition: A | Discharge status: Home / Self Care | Payer: 59 | Attending: Emergency Medicine | Admitting: Emergency Medicine

## 2019-07-01 DIAGNOSIS — R002 Palpitations: Secondary | ICD-10-CM | POA: Diagnosis present

## 2019-07-01 DIAGNOSIS — F419 Anxiety disorder, unspecified: Secondary | ICD-10-CM | POA: Diagnosis not present

## 2019-07-01 DIAGNOSIS — R079 Chest pain, unspecified: Secondary | ICD-10-CM

## 2019-07-01 LAB — CBC
HCT: 44 % (ref 39.0–52.0)
Hemoglobin: 14 g/dL (ref 13.0–17.0)
MCH: 27.8 pg (ref 26.0–34.0)
MCHC: 31.8 g/dL (ref 30.0–36.0)
MCV: 87.3 fL (ref 80.0–100.0)
Platelets: 362 10*3/uL (ref 150–400)
RBC: 5.04 MIL/uL (ref 4.22–5.81)
RDW: 13 % (ref 11.5–15.5)
WBC: 8 10*3/uL (ref 4.0–10.5)
nRBC: 0 % (ref 0.0–0.2)

## 2019-07-01 LAB — BASIC METABOLIC PANEL
Anion gap: 10 (ref 5–15)
BUN: 9 mg/dL (ref 6–20)
CO2: 27 mmol/L (ref 22–32)
Calcium: 9.1 mg/dL (ref 8.9–10.3)
Chloride: 100 mmol/L (ref 98–111)
Creatinine, Ser: 0.92 mg/dL (ref 0.61–1.24)
GFR calc Af Amer: >60 mL/min (ref >60)
GFR calc non Af Amer: >60 mL/min (ref >60)
Glucose, Bld: 164 mg/dL — ABNORMAL HIGH (ref 70–99)
Potassium: 3.4 mmol/L — ABNORMAL LOW (ref 3.5–5.1)
Sodium: 137 mmol/L (ref 135–145)

## 2019-07-01 LAB — TROPONIN I (HIGH SENSITIVITY)
Troponin I (High Sensitivity): <2 ng/L (ref <18)
Troponin I (High Sensitivity): <2 ng/L (ref <18)

## 2019-07-01 MED ORDER — PROPRANOLOL HCL 20 MG PO TABS
20.0000 mg | ORAL_TABLET | Freq: Once | ORAL | Status: AC
  Administered 2019-07-01: 05:00:00 20 mg via ORAL
  Filled 2019-07-01: qty 1

## 2019-07-01 MED ORDER — PROPRANOLOL HCL 20 MG PO TABS: 20.0000 mg | ORAL_TABLET | Freq: Three times a day (TID) | ORAL | 0 refills | Status: DC

## 2019-07-01 NOTE — ED Provider Notes (Signed)
[facility] Emergency Department Provider Note  ____________________________________________  Time seen: Approximately 4:25 AM  I have reviewed the triage vital signs and the nursing notes.   HISTORY  Chief Complaint Palpitations    HPI Scott Li is a 33 y.o. male with no significant past medical history comes to the ED complaining of frequent episodes of palpitations, diaphoresis, tingling and shivering, associated with occasional vomiting.  Also associated with teeth grinding.  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  No specific aggravating or alleviating factors.  No SI HI or hallucinations.  Takes no medications.      Past Medical History:  Diagnosis Date  . Nasal polyp      There are no active problems to display for this patient.    Past Surgical History:  Procedure Laterality Date  . NASAL POLYP EXCISION       Prior to Admission medications   Medication Sig Start Date End Date Taking? Authorizing Provider  albuterol (PROVENTIL HFA;VENTOLIN HFA) 108 (90 Base) MCG/ACT inhaler Inhale 2 puffs into the lungs every 6 (six) hours as needed for wheezing or shortness of breath. 01/09/19   Scott Sickle, MD  multivitamin (ONE-A-DAY MEN'S) TABS tablet Take 1 tablet by mouth daily.    [provider]  propranolol (INDERAL) 20 MG tablet Take 1 tablet (20 mg total) by mouth 3 (three) times daily. 07/01/19   Sharman Cheek, MD     Allergies Other   Family History  Problem Relation Age of Onset  . Cancer Maternal Grandfather   . High blood pressure Maternal Grandfather   . Diabetes Other   . High blood pressure Other   . Diabetes Maternal Aunt     Social History Social History   Tobacco Use  . Smoking status: Never Smoker  . Smokeless tobacco: Never Used  Substance Use Topics  . Alcohol use: Yes    Comment: Occasional   . Drug  use: Not Currently    Review of Systems  Constitutional:   No fever or chills.  ENT:   No sore throat. No rhinorrhea. Cardiovascular:   No chest pain or syncope.  Positive palpitations Respiratory:   No dyspnea or cough. Gastrointestinal:   Negative for abdominal pain, vomiting and diarrhea.  Musculoskeletal:   Negative for focal pain or swelling All other systems reviewed and are negative except as documented above in ROS and HPI.  ____________________________________________   PHYSICAL EXAM:  VITAL SIGNS: ED Triage Vitals [07/01/19 0028]  Enc Vitals Group     BP 131/66     Pulse Rate 87     Resp 20     Temp 98.3 F (36.8 C)     Temp Source Oral     SpO2 95 %     Weight (!) 315 lb (142.9 kg)     Height 6\' 4"  (1.93 m)     Head Circumference      Peak Flow      Pain Score 0     Pain Loc      Pain Edu?      Excl. in GC?     Vital signs reviewed, nursing assessments reviewed.   Constitutional:   Alert and oriented. Non-toxic appearance. Eyes:   Conjunctivae are normal. EOMI. PERRL. ENT      Head:   Normocephalic and atraumatic.      Nose:   No congestion/rhinnorhea.  Mouth/Throat:   MMM, no pharyngeal erythema. No peritonsillar mass.       Neck:   No meningismus. Full ROM.  Thyroid nonpalpable Hematological/Lymphatic/Immunilogical:   No cervical lymphadenopathy. Cardiovascular:   RRR, heart rate 75. Symmetric bilateral radial and DP pulses.  No murmurs. Cap refill less than 2 seconds. Respiratory:   Normal respiratory effort without tachypnea/retractions. Breath sounds are clear and equal bilaterally. No wheezes/rales/rhonchi. Gastrointestinal:   Soft and nontender. Non distended. There is no CVA tenderness.  No rebound, rigidity, or guarding.  Musculoskeletal:   Normal range of motion in all extremities. No joint effusions.  No lower extremity tenderness.  No edema. Neurologic:   Normal speech and language.  Motor grossly intact. No acute focal neurologic  deficits are appreciated.  Skin:    Skin is warm, dry and intact. No rash noted.  No petechiae, purpura, or bullae.  ____________________________________________    LABS (pertinent positives/negatives) (all labs ordered are listed, but only abnormal results are displayed) Labs Reviewed  BASIC METABOLIC PANEL - Abnormal; Notable for the following components:      Result Value   Potassium 3.4 (*)    Glucose, Bld 164 (*)    All other components within normal limits  CBC  TROPONIN I (HIGH SENSITIVITY)  TROPONIN I (HIGH SENSITIVITY)   ____________________________________________   EKG  Interpreted by me Normal sinus rhythm rate of 81, normal axis intervals QRS ST segments and T waves.  ____________________________________________    RADIOLOGY  Dg Chest 1 View  Result Date: 07/01/2019 CLINICAL DATA:  33 year old male with palpitation. EXAM: CHEST  1 VIEW COMPARISON:  Chest radiograph dated 01/09/2019 FINDINGS: The heart size and mediastinal contours are within normal limits. Both lungs are clear. The visualized skeletal structures are unremarkable. IMPRESSION: No active disease. Electronically Signed   By: Anner Crete M.D.   On: 07/01/2019 03:25    ____________________________________________   PROCEDURES Procedures  ____________________________________________    CLINICAL IMPRESSION / ASSESSMENT AND PLAN / ED COURSE  Medications ordered in the ED: Medications  propranolol (INDERAL) tablet 20 mg (has no administration in time range)    Pertinent labs & imaging results that were available during my care of the patient were reviewed by me and considered in my medical decision making (see chart for details).  Scott Li was evaluated in Emergency Department on 07/01/2019 for the symptoms described in the history of present illness. He was evaluated in the context of the global COVID-19 pandemic, which necessitated consideration that the patient might be at risk for  infection with the SARS-CoV-2 virus that causes COVID-19. Institutional protocols and algorithms that pertain to the evaluation of patients at risk for COVID-19 are in a state of rapid change based on information released by regulatory bodies including the CDC and federal and state organizations. These policies and algorithms were followed during the patient's care in the ED.   Patient presents with symptoms of anxiousness.  Overall presentation is atypical for any acute organic pathology.Considering the patient's symptoms, medical history, and physical examination today, I have low suspicion for ACS, PE, TAD, pneumothorax, carditis, mediastinitis, pneumonia, CHF, or sepsis.  Highly suspect episodes of anxiety and panic.  Started on 3 times daily propranolol, follow-up with primary care and outpatient psychiatry.  No imminent safety concerns.      ____________________________________________   FINAL CLINICAL IMPRESSION(S) / ED DIAGNOSES    Final diagnoses:  Heart palpitations  Anxiety     ED Discharge Orders  Ordered    propranolol (INDERAL) 20 MG tablet  3 times daily     07/01/19 0424          Portions of this note were generated with dragon dictation software. Dictation errors may occur despite best attempts at proofreading.   Sharman CheekStafford, Kalicia Dufresne, MD 07/01/19 [phone]

## 2019-07-01 NOTE — ED Triage Notes (Addendum)
Patient to ER for c/o palpitations, diaphoretic episodes, feeling uneasy at times, and chills "like in my legs and hands", nausea, emesis x2 yesterday. Also reports he has started grinding his teeth. Reports increased stress at work. Patient denies current chest pain, but had some earlier.

## 2019-08-12 ENCOUNTER — Encounter (HOSPITAL_COMMUNITY): Payer: Self-pay | Admitting: Psychology

## 2019-08-12 ENCOUNTER — Other Ambulatory Visit: Payer: Self-pay

## 2019-08-12 ENCOUNTER — Ambulatory Visit (HOSPITAL_COMMUNITY): Payer: Self-pay | Admitting: Psychology

## 2019-08-12 NOTE — Progress Notes (Signed)
Scott Li is a 33 y.o. male patient who didn't show for his virtual webex therapy assessment.  Pt didn't answer phone either.Marland Kitchen        Jan Fireman, [facility]

## 2019-10-16 ENCOUNTER — Encounter: Payer: Self-pay | Admitting: Internal Medicine

## 2019-10-16 ENCOUNTER — Other Ambulatory Visit: Payer: Self-pay

## 2019-10-16 ENCOUNTER — Ambulatory Visit: Payer: 59 | Admitting: Internal Medicine

## 2019-10-16 VITALS — BP 128/77 | HR 77 | Ht 76.0 in | Wt 367.0 lb

## 2019-10-16 DIAGNOSIS — R079 Chest pain, unspecified: Secondary | ICD-10-CM

## 2019-10-16 DIAGNOSIS — R12 Heartburn: Secondary | ICD-10-CM

## 2019-10-16 DIAGNOSIS — R072 Precordial pain: Secondary | ICD-10-CM

## 2019-10-16 NOTE — Progress Notes (Signed)
Cardiology Office Note:    Date:  10/16/2019   ID:  Scott Li, DOB 11/11/1985, MRN [id number]  PCP:  Patient, No Pcp Per  Cardiologist:  Parke PoissonGayatri A Sophiya Morello, MD  Electrophysiologist:  None   Referring MD: Scott Li, [place], MD   Chief Complaint: Chest pain  History of Present Illness:    Scott Li is a 33 y.o. male with a hx of nasal polyps and no other significant cardiopulmonary history who presents today for evaluation of chest pain.  He gets a hot feeling in the middle of chest and right side of the neck. Worse with anxiety, heart races with anxiety.  He notices this when he eats and when he is driving.  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  He has lots of daily physical activity.  Occurs 2-3 x a week, minutes to hours in duration.  Has history of heart burn, takes tums and goes away in 30 mins.  He notes shortness of breath when he eats too much and occasionally with activity.  Family history: Mgf - 60s heart disease. No family history of MI or sudden cardiac death.  Non-smoker.  The patient denies palpitations, PND, orthopnea, or leg swelling. Denies syncope or presyncope. Denies dizziness or lightheadedness. Denies snoring.  Past Medical History:  Diagnosis Date  . Nasal polyp     Past Surgical History:  Procedure Laterality Date  . NASAL POLYP EXCISION      Current Medications: No outpatient medications have been marked as taking for the 10/16/19 encounter (Office Visit) with Parke PoissonAcharya, Maylynn Orzechowski A, MD.     Allergies:   Other   Social History   Socioeconomic History  . Marital status: Married    Spouse name: Not on file  . Number of children: Not on file  . Years of education: Not on file  . Highest education level: Not on file  Occupational History  . Not on file  Tobacco Use  . Smoking status: Never Smoker  . Smokeless tobacco: Never Used  Substance and Sexual Activity  . Alcohol  use: Yes    Comment: Occasional   . Drug use: Not Currently  . Sexual activity: Yes  Other Topics Concern  . Not on file  Social History Narrative  . Not on file   Social Determinants of Health   Financial Resource Strain:   . Difficulty of Paying Living Expenses: Not on file  Food Insecurity:   . Worried About Programme researcher, broadcasting/film/videounning Out of Food in the Last Year: Not on file  . Ran Out of Food in the Last Year: Not on file  Transportation Needs:   . Lack of Transportation (Medical): Not on file  . Lack of Transportation (Non-Medical): Not on file  Physical Activity:   . Days of Exercise per Week: Not on file  . Minutes of Exercise per Session: Not on file  Stress:   . Feeling of Stress : Not on file  Social Connections:   . Frequency of Communication with Friends and Family: Not on file  . Frequency of Social Gatherings with Friends and Family: Not on file  . Attends Religious Services: Not on file  . Active Member of Clubs or Organizations: Not on file  . Attends BankerClub or Organization Meetings: Not on file  . Marital Status: Not on file     Family History: The patient's family history includes Cancer in his maternal grandfather; Diabetes in his maternal  aunt and another family member; High blood pressure in his maternal grandfather and another family member.  ROS:   Please see the history of present illness.    All other systems reviewed and are negative.  EKGs/Labs/Other Studies Reviewed:    The following studies were reviewed today:  EKG: Normal sinus rhythm, cannot rule out anterior infarct, rate 77 bpm  Epworth Sleepiness Scale: N/A  Recent Labs: 07/01/2019: BUN 9; Creatinine, Ser 0.92; Hemoglobin 14.0; Platelets 362; Potassium 3.4; Sodium 137  Recent Lipid Panel No results found for: CHOL, TRIG, HDL, CHOLHDL, VLDL, LDLCALC, LDLDIRECT  Physical Exam:    VS:  BP 128/77   Pulse 77   Ht 6\' 4"  (1.93 m)   Wt (!) 367 lb (166.5 kg)   SpO2 95%   BMI 44.67 kg/m     Wt  Readings from Last 5 Encounters:  10/16/19 (!) 367 lb (166.5 kg)  07/01/19 (!) 315 lb (142.9 kg)  01/09/19 (!) 345 lb (156.5 kg)  11/09/18 (!) 325 lb (147.4 kg)  06/21/18 (!) 332 lb 3.7 oz (150.7 kg)     Constitutional: No acute distress Eyes: sclera non-icteric, normal conjunctiva and lids ENMT: normal dentition, moist mucous membranes Cardiovascular: regular rhythm, normal rate, no murmurs. S1 and S2 normal. Radial pulses normal bilaterally. No jugular venous distention.  Respiratory: clear to auscultation bilaterally GI : normal bowel sounds, soft and nontender. No distention.   MSK: extremities warm, well perfused. No edema.  NEURO: grossly nonfocal exam, moves all extremities. PSYCH: alert and oriented x 3, normal mood and affect.   ASSESSMENT:    1. Chest pain of uncertain etiology   2. Precordial pain   3. Heart burn    PLAN:    Chest pain-for chest pain we will need an evaluation to rule out ischemia.  He has a physical job and occasionally notes dyspnea on exertion.  He does not endorse shortness of breath with deep breathing.  We obtain a nuclear stress test which I reviewed with the patient.  His symptoms correlate with eating and while this may represent GERD, I cannot exclude a left ventricular outflow tract obstructive process, which can also be evaluated with echo.  Heartburn-I have asked him to keep note of his symptoms and continue to take Tums if it is helpful for his discomfort in his chest.  If his chest pain is not relieved with Tums, he should consider presentation to the emergency department if his symptoms are concerning.   TIME SPENT WITH PATIENT:  30 minutes of direct patient care. More than 50% of that time was spent on coordination of care and counseling regarding the above problems.  Cherlynn Kaiser, MD [place]  [facility]   Medication Adjustments/Labs and Tests Ordered: Current medicines are reviewed at length with the patient today.   Concerns regarding medicines are outlined above.  Orders Placed This Encounter  Procedures  . MYOCARDIAL PERFUSION IMAGING  . EKG 12-Lead  . ECHOCARDIOGRAM COMPLETE   No orders of the defined types were placed in this encounter.   Patient Instructions  Medication Instructions: NO MEDICATION CHANGES If you need a refill on your cardiac medications before your next appointment, please call your pharmacy*  Lab Work: NONE AT THIS TIME  Testing/Procedures: WILL SCHEDULE AT [place], [place]  Your physician has requested that you have an echocardiogram. Echocardiography is a painless test that uses sound waves to create images of your heart. It provides your doctor with information about  the size and shape of your heart and how well your heart's chambers and valves are working. This procedure takes approximately one hour. There are no restrictions for this procedure.  YOU WILL NEED TO GET A COVID 19 TEST 3 DAY  PRIOR TO STRESS TEST AND QUARANTINE ([address] ) Your physician has requested that you have en exercise stress myoview. For further information please visit [email or web address] Please follow instruction sheet, as given.   Follow-Up: At [facility], you and your health needs are our priority.  As part of our continuing mission to provide you with exceptional heart care, we have created designated Provider Care Teams.  These Care Teams include your primary Cardiologist (physician) and Advanced Practice Providers (APPs -  Physician Assistants and Nurse Practitioners) who all work together to provide you with the care you need, when you need it.  Your next appointment:   1 month(s)  The format for your next appointment:   In Person  Provider:   Weston Brass, MD

## 2019-10-16 NOTE — Patient Instructions (Addendum)
Medication Instructions: NO MEDICATION CHANGES If you need a refill on your cardiac medications before your next appointment, please call your pharmacy*  Lab Work: NONE AT THIS TIME  Testing/Procedures: WILL SCHEDULE AT [place], [place]  Your physician has requested that you have an echocardiogram. Echocardiography is a painless test that uses sound waves to create images of your heart. It provides your doctor with information about the size and shape of your heart and how well your heart's chambers and valves are working. This procedure takes approximately one hour. There are no restrictions for this procedure.  YOU WILL NEED TO GET A COVID 19 TEST 3 DAY  PRIOR TO STRESS TEST AND QUARANTINE ([address] ) Your physician has requested that you have en exercise stress myoview. For further information please visit [email or web address]. Please follow instruction sheet, as given.   Follow-Up: At [facility], you and your health needs are our priority.  As part of our continuing mission to provide you with exceptional heart care, we have created designated Provider Care Teams.  These Care Teams include your primary Cardiologist (physician) and Advanced Practice Providers (APPs -  Physician Assistants and Nurse Practitioners) who all work together to provide you with the care you need, when you need it.  Your next appointment:   1 month(s)  The format for your next appointment:   In Person  Provider:   Cherlynn Kaiser, MD

## 2019-11-06 ENCOUNTER — Telehealth (HOSPITAL_COMMUNITY): Payer: Self-pay

## 2019-11-06 ENCOUNTER — Other Ambulatory Visit (HOSPITAL_COMMUNITY)
Admission: RE | Admit: 2019-11-06 | Discharge: 2019-11-06 | Disposition: A | Discharge status: Home / Self Care | Payer: 59 | Source: Ambulatory Visit | Attending: Internal Medicine | Admitting: Internal Medicine

## 2019-11-06 ENCOUNTER — Telehealth (HOSPITAL_COMMUNITY): Payer: Self-pay | Admitting: *Deleted

## 2019-11-06 DIAGNOSIS — Z01812 Encounter for preprocedural laboratory examination: Secondary | ICD-10-CM | POA: Diagnosis present

## 2019-11-06 DIAGNOSIS — Z20828 Contact with and (suspected) exposure to other viral communicable diseases: Secondary | ICD-10-CM | POA: Insufficient documentation

## 2019-11-06 NOTE — Telephone Encounter (Signed)
Left message on voicemail in reference to upcoming appointment scheduled for 11/10/19. Phone number given for a call back so details instructions can be given. Scott Li

## 2019-11-06 NOTE — Telephone Encounter (Signed)
Spoke with the patient, instructions given. He stated that he would be here for his test. Asked to call back with any questions. S.Ercilia Bettinger EMTP 

## 2019-11-07 LAB — NOVEL CORONAVIRUS, NAA (HOSP ORDER, SEND-OUT TO REF LAB; TAT 18-24 HRS): SARS-CoV-2, NAA: NOT DETECTED

## 2019-11-10 ENCOUNTER — Ambulatory Visit (HOSPITAL_COMMUNITY): Payer: 59

## 2019-11-10 ENCOUNTER — Other Ambulatory Visit (HOSPITAL_COMMUNITY): Payer: 59

## 2019-11-11 ENCOUNTER — Other Ambulatory Visit: Payer: Self-pay

## 2019-11-11 ENCOUNTER — Ambulatory Visit
Admission: EM | Admit: 2019-11-11 | Discharge: 2019-11-11 | Disposition: A | Discharge status: Home / Self Care | Payer: 59 | Attending: Physician Assistant | Admitting: Physician Assistant

## 2019-11-11 ENCOUNTER — Ambulatory Visit (HOSPITAL_COMMUNITY): Payer: 59

## 2019-11-11 DIAGNOSIS — Z202 Contact with and (suspected) exposure to infections with a predominantly sexual mode of transmission: Secondary | ICD-10-CM

## 2019-11-11 MED ORDER — CEFTRIAXONE SODIUM 1 G IJ SOLR
1.0000 g | Freq: Once | INTRAMUSCULAR | Status: AC
  Administered 2019-11-11: 12:00:00 1 g via INTRAMUSCULAR

## 2019-11-11 NOTE — Discharge Instructions (Signed)
You were treated empirically for gonorrhea. Rocephin 1000mg  injection given in office today. Testing sent, you will be contacted with any positive results that requires further treatment. Refrain from sexual activity and alcohol use for the next 7 days. If developing testicular swelling/pain, penile lesion/sore, follow up for reevaluation.

## 2019-11-11 NOTE — ED Triage Notes (Signed)
Pt states his wife tested and tx'd for gonorrhea. Pt deneis any sx

## 2019-11-11 NOTE — ED Provider Notes (Signed)
[facility]    CSN: [id number] Arrival date & time: 11/11/19  1055      History   Chief Complaint Chief Complaint  Patient presents with  . Exposure to STD    HPI Scott Li is a 34 y.o. male.   34 year old male comes in for STD check after exposure.  States spouse was tested positive for gonorrhea.  He is asymptomatic.  Denies fever, chills, body aches.  Denies abdominal pain, nausea, vomiting, diarrhea.  Denies urinary symptoms such as frequency, dysuria, hematuria.  Denies penile discharge, penile lesion, testicular swelling, testicular pain.  Sexually active with one male partner.     Past Medical History:  Diagnosis Date  . Nasal polyp     There are no problems to display for this patient.   Past Surgical History:  Procedure Laterality Date  . NASAL POLYP EXCISION         Home Medications    Prior to Admission medications   Medication Sig Start Date End Date Taking? Authorizing Provider  multivitamin (ONE-A-DAY MEN'S) TABS tablet Take 1 tablet by mouth daily.    [provider]    Family History Family History  Problem Relation Age of Onset  . Cancer Maternal Grandfather   . High blood pressure Maternal Grandfather   . Diabetes Other   . High blood pressure Other   . Diabetes Maternal Aunt     Social History Social History   Tobacco Use  . Smoking status: Never Smoker  . Smokeless tobacco: Never Used  Substance Use Topics  . Alcohol use: Yes    Comment: Occasional   . Drug use: Not Currently     Allergies   Other   Review of Systems Review of Systems  Reason unable to perform ROS: See HPI as above.     Physical Exam Triage Vital Signs ED Triage Vitals [11/11/19 1113]  Enc Vitals Group     BP (!) 147/87     Pulse Rate 78     Resp 18     Temp 98.4 F (36.9 C)     Temp Source Oral     SpO2 96 %     Weight      Height      Head Circumference      Peak Flow      Pain Score 0     Pain Loc    Pain Edu?      Excl. in GC?    No data found.  Updated Vital Signs BP (!) 147/87 (BP Location: Left Arm)   Pulse 78   Temp 98.4 F (36.9 C) (Oral)   Resp 18   SpO2 96%   Visual Acuity Right Eye Distance:   Left Eye Distance:   Bilateral Distance:    Right Eye Near:   Left Eye Near:    Bilateral Near:     Physical Exam Constitutional:      General: He is not in acute distress.    Appearance: He is well-developed. He is not diaphoretic.  HENT:     Head: Normocephalic and atraumatic.  Eyes:     Conjunctiva/sclera: Conjunctivae normal.     Pupils: Pupils are equal, round, and reactive to light.  Pulmonary:     Effort: Pulmonary effort is normal. No respiratory distress.  Skin:    General: Skin is warm and dry.  Neurological:     Mental Status: He is alert and oriented to person, place, and time.  UC Treatments / Results  Labs (all labs ordered are listed, but only abnormal results are displayed) Labs Reviewed  HIV ANTIBODY (ROUTINE TESTING W REFLEX)  RPR  CYTOLOGY, (ORAL, ANAL, URETHRAL) ANCILLARY ONLY    EKG   Radiology No results found.  Procedures Procedures (including critical care time)  Medications Ordered in UC Medications  cefTRIAXone (ROCEPHIN) injection 1 g (1 g Intramuscular Given 11/11/19 1145)    Initial Impression / Assessment and Plan / UC Course  I have reviewed the triage vital signs and the nursing notes.  Pertinent labs & imaging results that were available during my care of the patient were reviewed by me and considered in my medical decision making (see chart for details).    Patient was treated empirically for Gonorrhea. Rocephin given in office today.  Patient also requested HIV, RPR testing. Cytology sent, patient will be contacted with any positive results that require additional treatment. Patient to refrain from sexual activity for the next 7 days. Return precautions given.   Final Clinical Impressions(s) / UC Diagnoses     Final diagnoses:  STD exposure   ED Prescriptions    None     PDMP not reviewed this encounter.   Ok Edwards, PA-C 11/11/19 1309

## 2019-11-12 LAB — RPR: RPR Ser Ql: NONREACTIVE

## 2019-11-12 LAB — HIV ANTIBODY (ROUTINE TESTING W REFLEX): HIV Screen 4th Generation wRfx: NONREACTIVE

## 2019-11-13 LAB — CHLAMYDIA/GONOCOCCUS/TRICHOMONAS, NAA
Chlamydia by NAA: NEGATIVE
Gonococcus by NAA: NEGATIVE
Trich vag by NAA: NEGATIVE

## 2019-11-13 LAB — SPECIMEN STATUS REPORT

## 2019-11-14 ENCOUNTER — Ambulatory Visit: Payer: 59 | Admitting: Internal Medicine

## 2019-12-04 ENCOUNTER — Ambulatory Visit (HOSPITAL_COMMUNITY): Payer: 59

## 2019-12-04 ENCOUNTER — Other Ambulatory Visit (HOSPITAL_COMMUNITY): Payer: 59

## 2019-12-05 ENCOUNTER — Ambulatory Visit (HOSPITAL_COMMUNITY): Payer: 59

## 2019-12-08 ENCOUNTER — Ambulatory Visit: Payer: 59 | Admitting: Internal Medicine

## 2019-12-10 ENCOUNTER — Encounter (HOSPITAL_COMMUNITY): Payer: Self-pay | Admitting: Radiology

## 2019-12-30 ENCOUNTER — Telehealth (HOSPITAL_COMMUNITY): Payer: Self-pay | Admitting: Radiology

## 2019-12-30 NOTE — Telephone Encounter (Signed)
noted 

## 2019-12-30 NOTE — Telephone Encounter (Signed)
Just an FYI. We have made several attempts to contact this patient including sending a letter to schedule or reschedule their echocardiogram. We will be removing the patient from the echo WQ. 2.3.21 @1034  LMOM will mail letter evd  1.5.21@1104  spoke with patient - will cb 1.7.21 to reschedule evd  1.4.21 No show evd  Thank you

## 2020-01-21 ENCOUNTER — Encounter: Payer: Self-pay | Admitting: Internal Medicine

## 2021-03-25 ENCOUNTER — Emergency Department
Admission: EM | Admit: 2021-03-25 | Discharge: 2021-03-25 | Disposition: A | Discharge status: Home / Self Care | Payer: No Typology Code available for payment source | Attending: Emergency Medicine | Admitting: Emergency Medicine

## 2021-03-25 ENCOUNTER — Emergency Department: Payer: No Typology Code available for payment source

## 2021-03-25 ENCOUNTER — Encounter: Payer: Self-pay | Admitting: Medical Oncology

## 2021-03-25 ENCOUNTER — Other Ambulatory Visit: Payer: Self-pay

## 2021-03-25 DIAGNOSIS — M546 Pain in thoracic spine: Secondary | ICD-10-CM | POA: Diagnosis not present

## 2021-03-25 DIAGNOSIS — M79632 Pain in left forearm: Secondary | ICD-10-CM | POA: Insufficient documentation

## 2021-03-25 DIAGNOSIS — Y92410 Unspecified street and highway as the place of occurrence of the external cause: Secondary | ICD-10-CM | POA: Insufficient documentation

## 2021-03-25 DIAGNOSIS — M79661 Pain in right lower leg: Secondary | ICD-10-CM | POA: Diagnosis not present

## 2021-03-25 MED ORDER — MELOXICAM 15 MG PO TABS: 15.0000 mg | ORAL_TABLET | Freq: Every day | ORAL | 2 refills | Status: AC

## 2021-03-25 MED ORDER — METHOCARBAMOL 500 MG PO TABS: 500.0000 mg | ORAL_TABLET | Freq: Three times a day (TID) | ORAL | 0 refills | Status: AC | PRN
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
## 2021-03-25 NOTE — Discharge Instructions (Signed)
Take meloxicam and Robaxin as directed. 

## 2022-05-02 ENCOUNTER — Other Ambulatory Visit: Payer: Self-pay

## 2022-05-02 ENCOUNTER — Encounter (HOSPITAL_BASED_OUTPATIENT_CLINIC_OR_DEPARTMENT_OTHER): Payer: Self-pay

## 2022-05-02 ENCOUNTER — Emergency Department (HOSPITAL_BASED_OUTPATIENT_CLINIC_OR_DEPARTMENT_OTHER)
Admission: EM | Admit: 2022-05-02 | Discharge: 2022-05-02 | Disposition: A | Discharge status: Home / Self Care | Payer: Self-pay | Attending: Emergency Medicine | Admitting: Emergency Medicine

## 2022-05-02 DIAGNOSIS — R0789 Other chest pain: Secondary | ICD-10-CM | POA: Insufficient documentation

## 2022-05-02 DIAGNOSIS — E860 Dehydration: Secondary | ICD-10-CM | POA: Insufficient documentation

## 2022-05-02 DIAGNOSIS — R7309 Other abnormal glucose: Secondary | ICD-10-CM | POA: Insufficient documentation

## 2022-05-02 LAB — COMPREHENSIVE METABOLIC PANEL
ALT: 26 U/L (ref 0–44)
AST: 20 U/L (ref 15–41)
Albumin: 4 g/dL (ref 3.5–5.0)
Alkaline Phosphatase: 81 U/L (ref 38–126)
Anion gap: 5 (ref 5–15)
BUN: 14 mg/dL (ref 6–20)
CO2: 30 mmol/L (ref 22–32)
Calcium: 9.2 mg/dL (ref 8.9–10.3)
Chloride: 101 mmol/L (ref 98–111)
Creatinine, Ser: 0.99 mg/dL (ref 0.61–1.24)
GFR, Estimated: >60 mL/min (ref >60)
Glucose, Bld: 148 mg/dL — ABNORMAL HIGH (ref 70–99)
Potassium: 3.5 mmol/L (ref 3.5–5.1)
Sodium: 136 mmol/L (ref 135–145)
Total Bilirubin: 0.5 mg/dL (ref 0.3–1.2)
Total Protein: 7.6 g/dL (ref 6.5–8.1)

## 2022-05-02 LAB — CBC WITH DIFFERENTIAL/PLATELET
Abs Immature Granulocytes: 0.03 10*3/uL (ref 0.00–0.07)
Basophils Absolute: 0.1 10*3/uL (ref 0.0–0.1)
Basophils Relative: 1 %
Eosinophils Absolute: 0.4 10*3/uL (ref 0.0–0.5)
Eosinophils Relative: 5 %
HCT: 44.9 % (ref 39.0–52.0)
Hemoglobin: 14.7 g/dL (ref 13.0–17.0)
Immature Granulocytes: 0 %
Lymphocytes Relative: 37 %
Lymphs Abs: 3.2 10*3/uL (ref 0.7–4.0)
MCH: 28.2 pg (ref 26.0–34.0)
MCHC: 32.7 g/dL (ref 30.0–36.0)
MCV: 86 fL (ref 80.0–100.0)
Monocytes Absolute: 0.9 10*3/uL (ref 0.1–1.0)
Monocytes Relative: 10 %
Neutro Abs: 4.1 10*3/uL (ref 1.7–7.7)
Neutrophils Relative %: 47 %
Platelets: 359 10*3/uL (ref 150–400)
RBC: 5.22 MIL/uL (ref 4.22–5.81)
RDW: 13.2 % (ref 11.5–15.5)
WBC: 8.7 10*3/uL (ref 4.0–10.5)
nRBC: 0 % (ref 0.0–0.2)

## 2022-05-02 LAB — CBG MONITORING, ED: Glucose-Capillary: 139 mg/dL — ABNORMAL HIGH (ref 70–99)

## 2022-05-02 MED ORDER — SODIUM CHLORIDE 0.9 % IV BOLUS
1000.0000 mL | Freq: Once | INTRAVENOUS | Status: AC
  Administered 2022-05-02: 1000 mL via INTRAVENOUS

## 2022-05-02 NOTE — ED Triage Notes (Signed)
Pt c/o feeling lightheaded and having dry mouth, headache. 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 Denies vomiting. NAD during triage, states drinking water and gatorade.
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
## 2022-12-20 ENCOUNTER — Ambulatory Visit
Admission: EM | Admit: 2022-12-20 | Discharge: 2022-12-20 | Disposition: A | Discharge status: Home / Self Care | Payer: Self-pay | Attending: Family Medicine | Admitting: Family Medicine

## 2022-12-20 DIAGNOSIS — J101 Influenza due to other identified influenza virus with other respiratory manifestations: Secondary | ICD-10-CM

## 2022-12-20 LAB — POCT INFLUENZA A/B
Influenza A, POC: NEGATIVE
Influenza B, POC: POSITIVE — AB

## 2022-12-20 MED ORDER — OSELTAMIVIR PHOSPHATE 75 MG PO CAPS: 75.0000 mg | ORAL_CAPSULE | Freq: Two times a day (BID) | ORAL | 0 refills | Status: AC

## 2022-12-20 MED ORDER — ONDANSETRON 4 MG PO TBDP
4.0000 mg | ORAL_TABLET | Freq: Once | ORAL | Status: AC
  Administered 2022-12-20: 4 mg via ORAL

## 2022-12-20 MED ORDER — ONDANSETRON 4 MG PO TBDP: 4.0000 mg | ORAL_TABLET | Freq: Three times a day (TID) | ORAL | 0 refills | Status: AC | PRN

## 2022-12-20 MED ORDER — PROMETHAZINE-DM 6.25-15 MG/5ML PO SYRP: 5.0000 mL | ORAL_SOLUTION | Freq: Four times a day (QID) | ORAL | 0 refills | Status: AC | PRN

## 2022-12-20 NOTE — ED Triage Notes (Signed)
Pt reports he has a fever, body aches, chest tightness, cough, scratchy throat, nausea x 1 day

## 2022-12-20 NOTE — ED Provider Notes (Signed)
[facility]    CSN: [id number] Arrival date & time: 12/20/22  1546      History   Chief Complaint Chief Complaint  Patient presents with   Fever    HPI Scott Li is a 37 y.o. male.   Patient presenting today with 1 day history of fever, body aches, chills, cough, sore throat, congestion, chest tightness.  Denies chest pain, shortness of breath, abdominal pain, nausea vomiting or diarrhea.  So far trying TheraFlu and over-the-counter fever reducers with minimal relief.    Past Medical History:  Diagnosis Date   Nasal polyp     There are no problems to display for this patient.   Past Surgical History:  Procedure Laterality Date   NASAL POLYP EXCISION         Home Medications    Prior to Admission medications   Medication Sig Start Date End Date Taking? Authorizing Provider  ondansetron (ZOFRAN-ODT) 4 MG disintegrating tablet Take 1 tablet (4 mg total) by mouth every 8 (eight) hours as needed for nausea or vomiting. 12/20/22  Yes Volney American, PA-C  oseltamivir (TAMIFLU) 75 MG capsule Take 1 capsule (75 mg total) by mouth every 12 (twelve) hours. 12/20/22  Yes Volney American, PA-C  promethazine-dextromethorphan (PROMETHAZINE-DM) 6.25-15 MG/5ML syrup Take 5 mLs by mouth 4 (four) times daily as needed. 12/20/22  Yes Volney American, PA-C  multivitamin (ONE-A-DAY MEN'S) TABS tablet Take 1 tablet by mouth daily.    [provider]    Family History Family History  Problem Relation Age of Onset   Cancer Maternal Grandfather    High blood pressure Maternal Grandfather    Diabetes Other    High blood pressure Other    Diabetes Maternal Aunt     Social History Social History   Tobacco Use   Smoking status: Never   Smokeless tobacco: Never  Vaping Use   Vaping Use: Never used  Substance Use Topics   Alcohol use: Not Currently    Comment: Occasional    Drug use: Not Currently     Allergies   Patient has no  active allergies.   Review of Systems Review of Systems PER HPI  Physical Exam Triage Vital Signs ED Triage Vitals  Enc Vitals Group     BP 12/20/22 1602 (!) 154/82     Pulse Rate 12/20/22 1602 (!) 112     Resp 12/20/22 1602 20     Temp 12/20/22 1602 99.8 F (37.7 C)     Temp Source 12/20/22 1602 Oral     SpO2 12/20/22 1602 95 %     Weight --      Height --      Head Circumference --      Peak Flow --      Pain Score 12/20/22 1604 5     Pain Loc --      Pain Edu? --      Excl. in [place]? --    No data found.  Updated Vital Signs BP (!) 154/82 (BP Location: Right Arm)   Pulse (!) 112   Temp 99.8 F (37.7 C) (Oral)   Resp 20   SpO2 95%   Visual Acuity Right Eye Distance:   Left Eye Distance:   Bilateral Distance:    Right Eye Near:   Left Eye Near:    Bilateral Near:     Physical Exam Vitals and nursing note reviewed.  Constitutional:      Appearance: He is  well-developed.  HENT:     Head: Atraumatic.     Right Ear: External ear normal.     Left Ear: External ear normal.     Nose: Rhinorrhea present.     Mouth/Throat:     Pharynx: Posterior oropharyngeal erythema present. No oropharyngeal exudate.  Eyes:     Conjunctiva/sclera: Conjunctivae normal.     Pupils: Pupils are equal, round, and reactive to light.  Cardiovascular:     Rate and Rhythm: Normal rate and regular rhythm.  Pulmonary:     Effort: Pulmonary effort is normal. No respiratory distress.     Breath sounds: No wheezing or rales.  Musculoskeletal:        General: Normal range of motion.     Cervical back: Normal range of motion and neck supple.  Lymphadenopathy:     Cervical: No cervical adenopathy.  Skin:    General: Skin is warm and dry.  Neurological:     Mental Status: He is alert and oriented to person, place, and time.  Psychiatric:        Behavior: Behavior normal.      UC Treatments / Results  Labs (all labs ordered are listed, but only abnormal results are  displayed) Labs Reviewed  POCT INFLUENZA A/B - Abnormal; Notable for the following components:      Result Value   Influenza B, POC Positive (*)    All other components within normal limits    EKG   Radiology No results found.  Procedures Procedures (including critical care time)  Medications Ordered in UC Medications  ondansetron (ZOFRAN-ODT) disintegrating tablet 4 mg (4 mg Oral Given 12/20/22 1610)    Initial Impression / Assessment and Plan / UC Course  I have reviewed the triage vital signs and the nursing notes.  Pertinent labs & imaging results that were available during my care of the patient were reviewed by me and considered in my medical decision making (see chart for details).     Mildly tachycardic and hypertensive in triage, otherwise vital signs reassuring.  Influenza B testing positive.  Treat with Tamiflu, Zofran, Phenergan DM, supportive over-the-counter medications and home care.  Return for worsening symptoms.  Final Clinical Impressions(s) / UC Diagnoses   Final diagnoses:  Influenza B   Discharge Instructions   None    ED Prescriptions     Medication Sig Dispense Auth. Provider   oseltamivir (TAMIFLU) 75 MG capsule Take 1 capsule (75 mg total) by mouth every 12 (twelve) hours. 10 capsule Volney American, PA-C   ondansetron (ZOFRAN-ODT) 4 MG disintegrating tablet Take 1 tablet (4 mg total) by mouth every 8 (eight) hours as needed for nausea or vomiting. 20 tablet Volney American, [place]   promethazine-dextromethorphan (PROMETHAZINE-DM) 6.25-15 MG/5ML syrup Take 5 mLs by mouth 4 (four) times daily as needed. 100 mL Volney American, [place]      PDMP not reviewed this encounter.   Volney American, [place] 12/20/22 1624

## 2023-05-31 ENCOUNTER — Ambulatory Visit: Payer: Self-pay

## 2023-05-31 ENCOUNTER — Ambulatory Visit (HOSPITAL_COMMUNITY)
Admission: EM | Admit: 2023-05-31 | Discharge: 2023-05-31 | Disposition: A | Discharge status: Home / Self Care | Payer: No Payment, Other | Attending: Family | Admitting: Family

## 2023-05-31 DIAGNOSIS — F4322 Adjustment disorder with anxiety: Secondary | ICD-10-CM | POA: Insufficient documentation

## 2023-05-31 DIAGNOSIS — Z563 Stressful work schedule: Secondary | ICD-10-CM | POA: Insufficient documentation

## 2023-05-31 NOTE — ED Notes (Signed)
Patient Is discharging at this time. Printed AVS reviewed with patient by provider along with follow up appointments and resources. Patient denies SI, HI, and A/V/H. Valuables/belongings returned to patient. No s/s of current distress.  

## 2023-05-31 NOTE — Discharge Instructions (Addendum)

## 2023-05-31 NOTE — Progress Notes (Signed)
   05/31/23 1534  [facility] Triage Screening (Walk-ins at [facility] only)  How Did You Hear About [place]? Family/Friend  What Is the Reason for Your Visit/Call Today? Pt presents to [facility] voluntarily accompanied by his girlfriend seeking an evaluation for his job. Pt states he has been experiencing panic attacks for the past 3 days and has been calling out of work. He reports he told his job what was going on and they told him to go and be evaluated to make sure that he is okay and provide a doctor's note. Pt states he is interested in outpatient therapy. Pt denies SI/HI and AVH.  How Long Has This Been Causing You Problems? <Week  Have You Recently Had Any Thoughts About Hurting Yourself? No  Are You Planning to Commit Suicide/Harm Yourself At This time? No  Have you Recently Had Thoughts About Hurting Someone Karolee Ohs? No  Are You Planning To Harm Someone At This Time? No  Are you currently experiencing any auditory, visual or other hallucinations? No  Have You Used Any Alcohol or Drugs in the Past 24 Hours? No  Do you have any current medical co-morbidities that require immediate attention? No  Clinician description of patient physical appearance/behavior: calm, cooperative  What Do You Feel Would Help You the Most Today? Treatment for Depression or other mood problem  If access to [facility] was not available, would you have sought care in the Emergency Department? No  Determination of Need Routine (7 days)  Options For Referral Outpatient Therapy;Medication Management

## 2023-05-31 NOTE — ED Provider Notes (Signed)
Behavioral Health Urgent Care Medical Screening Exam  Patient Name: Scott Li MRN: [id number] Date of Evaluation: 05/31/23 Chief Complaint:   Diagnosis:  Final diagnoses:  Adjustment disorder with anxious mood    History of Present illness: Scott Li is a 37 y.o. male. Patient presents voluntarily to [facility] behavioral health for walk-in assessment.  Patient is accompanied by his fiance, Kathrine Cords. Patient prefers that fiance remain present during assessment.   Patient is assessed, face-to-face, by nurse practitioner. He is seated in assessment area, no acute distress. He is alert and oriented, pleasant and cooperative during assessment. Euthymic mood congruent affect during assessment.   Scott Li reports feeling increased anxiety and panic for approximately 3 days.  He identifies recent stressors as financial concerns and triggered by employment.  He is employed in [employer] reports position is "stressful."  Patient presents today as he has been directed to get a work excuse before returning to work.  He is not linked with outpatient psychiatry currently.  Reports similar episode of anxiety and panic 3 years ago.  He was briefly prescribed a medication at that time however it made him feel "numb" so he stopped the medication.  No history of inpatient psychiatric hospitalizations.  Family history includes patient's father diagnosed with an addiction disorder.  Scott Li is insightful today and willing to follow-up with outpatient psychiatry for counseling.  He will reconsider medications moving forward if recommended.   He has recently changed jobs and is waiting for employer sponsored healthcare insurance to begin.  Reviewed options including [facility] behavioral health and family services of the [place] for outpatient psychiatry follow-up.  Patient  presents with euthymic mood, congruent affect. He  denies suicidal and homicidal ideations. Denies history of  suicide attempts, denies history of self-harm.  Patient easily  contracts verbally for safety with this Clinical research associate.    Patient has normal speech and behavior.  He  denies auditory and visual hallucinations.  Patient is able to converse coherently with goal-directed thoughts and no distractibility or preoccupation.  Denies symptoms of paranoia.  Objectively there is no evidence of psychosis/mania or delusional thinking.  Scott Li resides in [place] with fiance. There is one weapon in home, [place] (fiance) will remove weapon for safe keeping outside of family home. Patient is employed in [employer]. He denies alcohol and substance use. Patient endorses average sleep and appetite.  Consulted with provider, Dr.  Lucianne Muss, and chart reviewed on 05/31/2023.   Patient offered support and encouragement.  Patient and fianc agree with plan for outpatient psychiatry follow-up.  Neysa Bonito denies safety concerns.   Patient and family are educated and verbalize understanding of mental health resources and other crisis services in the community. They are instructed to call 911 and present to the nearest emergency room should patient experience any suicidal/homicidal ideation, auditory/visual/hallucinations, or detrimental worsening of mental health condition.      Flowsheet Row ED from 05/31/2023 in [facility] ED from 12/20/2022 in [facility] ED from 05/02/2022 in [facility] Emergency Department at [facility]  C-SSRS RISK CATEGORY No Risk No Risk No Risk       Psychiatric Specialty Exam  Presentation  General Appearance:Appropriate for Environment; Casual  Eye Contact:Good  Speech:Clear and Coherent; Normal Rate  Speech Volume:Normal  Handedness:Right   Mood and Affect  Mood: Euthymic  Affect: Appropriate; Congruent   Thought Process  Thought Processes: Coherent; Goal Directed; Linear  Descriptions of  Associations:Intact  Orientation:Full (Time, Place and Person)  Thought Content:Logical; WDL    Hallucinations:None  Ideas of Reference:None  Suicidal Thoughts:No  Homicidal Thoughts:No   Sensorium  Memory: Immediate Good; Recent Good  Judgment: Good  Insight: Fair   Executive Functions  Concentration: Good  Attention Span: Good  Recall: Good  Fund of Knowledge: Good  Language: Good   Psychomotor Activity  Psychomotor Activity: Normal   Assets  Assets: Communication Skills; Desire for Improvement; Financial Resources/Insurance; Housing; Leisure Time; Physical Health; Resilience; Social Support   Sleep  Sleep: Fair  Number of hours: No data recorded  Physical Exam: Physical Exam Vitals and nursing note reviewed.  Constitutional:      Appearance: Normal appearance. He is well-developed.  HENT:     Head: Normocephalic and atraumatic.     Nose: Nose normal.  Cardiovascular:     Rate and Rhythm: Normal rate.  Pulmonary:     Effort: Pulmonary effort is normal.  Musculoskeletal:        General: Normal range of motion.     Cervical back: Normal range of motion.  Skin:    General: Skin is warm and dry.  Neurological:     Mental Status: He is alert and oriented to person, place, and time.  Psychiatric:        Attention and Perception: Attention and perception normal.        Mood and Affect: Mood and affect normal.        Speech: Speech normal.        Behavior: Behavior normal. Behavior is cooperative.        Thought Content: Thought content normal.        Cognition and Memory: Cognition and memory normal.    Review of Systems  Constitutional: Negative.   HENT: Negative.    Eyes: Negative.   Respiratory: Negative.    Cardiovascular: Negative.   Gastrointestinal: Negative.   Genitourinary: Negative.   Musculoskeletal: Negative.   Skin: Negative.   Neurological: Negative.   Psychiatric/Behavioral: Negative.     Blood pressure  138/77, pulse 73, temperature 98.2 F (36.8 C), temperature source Oral, resp. rate 18, SpO2 100%. There is no height or weight on file to calculate BMI.  Musculoskeletal: Strength & Muscle Tone: within normal limits Gait & Station: normal Patient leans: N/A   [facility] MSE Discharge Disposition for Follow up and Recommendations: Based on my evaluation the patient does not appear to have an emergency medical condition and can be discharged with resources and follow up care in outpatient services for Medication Management and Individual Therapy Follow-up with outpatient psychiatry, resources provided.  Lenard Lance, FNP 05/31/2023, 4:22 PM

## 2023-10-01 ENCOUNTER — Emergency Department (HOSPITAL_COMMUNITY)
Admission: EM | Admit: 2023-10-01 | Discharge: 2023-10-01 | Disposition: A | Discharge status: Home / Self Care | Payer: Self-pay | Attending: Emergency Medicine | Admitting: Emergency Medicine

## 2023-10-01 ENCOUNTER — Encounter (HOSPITAL_COMMUNITY): Payer: Self-pay

## 2023-10-01 ENCOUNTER — Emergency Department (HOSPITAL_COMMUNITY): Payer: Self-pay

## 2023-10-01 DIAGNOSIS — R059 Cough, unspecified: Secondary | ICD-10-CM | POA: Insufficient documentation

## 2023-10-01 MED ORDER — ALBUTEROL SULFATE HFA 108 (90 BASE) MCG/ACT IN AERS
2.0000 | INHALATION_SPRAY | RESPIRATORY_TRACT | Status: DC
  Administered 2023-10-01: 2 via RESPIRATORY_TRACT
  Filled 2023-10-01: qty 6.7

## 2023-10-01 NOTE — ED Triage Notes (Signed)
Pt c/o styes on both eyes x1 month and productive cough x3 weeks.    NAD noted.  Pt easily speaking full sentences.

## 2023-10-01 NOTE — Discharge Instructions (Addendum)
Use inhaler every 4-6 hours as needed for cough

## 2023-10-01 NOTE — ED Provider Notes (Signed)
[place] EMERGENCY DEPARTMENT AT [facility] Provider Note   CSN: [id number] Arrival date & time: 10/01/23  2956     History  Chief Complaint  Patient presents with   Cough   Eye Problem    Scott Li is a 37 y.o. male.  37 year old male presents with recurrent cough for 1 month.  States that the cough comes and goes.  Not associate with dyspnea or shortness of breath.  Patient denies any hemoptysis.  Cough is been nonproductive.  No recent fevers.  No orthopnea noted.  Denies any URI symptoms.  No leg pain or swelling.  No pleuritic pain.  Scott Li denies any history of asthma.  No treatment use for this prior to arrival.  Also has secondary complaint of styes to both eyes times several months.  Denies any eye pain or drainage.       Home Medications Prior to Admission medications   Medication Sig Start Date End Date Taking? Authorizing Provider  multivitamin (ONE-A-DAY MEN'S) TABS tablet Take 1 tablet by mouth daily.    [provider]  ondansetron (ZOFRAN-ODT) 4 MG disintegrating tablet Take 1 tablet (4 mg total) by mouth every 8 (eight) hours as needed for nausea or vomiting. 12/20/22   Particia Nearing, PA-C  oseltamivir (TAMIFLU) 75 MG capsule Take 1 capsule (75 mg total) by mouth every 12 (twelve) hours. 12/20/22   Particia Nearing, PA-C  promethazine-dextromethorphan (PROMETHAZINE-DM) 6.25-15 MG/5ML syrup Take 5 mLs by mouth 4 (four) times daily as needed. 12/20/22   Particia Nearing, PA-C      Allergies    Patient has no known allergies.    Review of Systems   Review of Systems  All other systems reviewed and are negative.   Physical Exam Updated Vital Signs BP 133/87 (BP Location: Left Arm)   Pulse 86   Temp 98.2 F (36.8 C) (Oral)   Resp 17   Ht 1.93 m (6\' 4" )   Wt (!) 147.4 kg   SpO2 100%   BMI 39.56 kg/m  Physical Exam Vitals and nursing note reviewed.  Constitutional:      General: Scott Li is not in acute distress.     Appearance: Normal appearance. Scott Li is well-developed. Scott Li is not toxic-appearing.  HENT:     Head: Normocephalic and atraumatic.  Eyes:     Conjunctiva/sclera: Conjunctivae normal.     Pupils: Pupils are equal, round, and reactive to light.     Comments: Bilateral eye stye is noted without evidence of infection  Neck:     Thyroid: No thyroid mass.     Trachea: No tracheal deviation.  Cardiovascular:     Rate and Rhythm: Normal rate and regular rhythm.     Heart sounds: Normal heart sounds. No murmur heard.    No gallop.  Pulmonary:     Effort: Pulmonary effort is normal. No respiratory distress.     Breath sounds: Normal breath sounds. No stridor. No decreased breath sounds, wheezing, rhonchi or rales.  Abdominal:     General: There is no distension.     Palpations: Abdomen is soft.     Tenderness: There is no abdominal tenderness. There is no rebound.  Musculoskeletal:        General: No tenderness. Normal range of motion.     Cervical back: Normal range of motion and neck supple.  Skin:    General: Skin is warm and dry.     Findings: No abrasion or rash.  Neurological:  Mental Status: Scott Li is alert and oriented to person, place, and time. Mental status is at baseline.     GCS: GCS eye subscore is 4. GCS verbal subscore is 5. GCS motor subscore is 6.     Cranial Nerves: Cranial nerves are intact. No cranial nerve deficit.     Sensory: No sensory deficit.     Motor: Motor function is intact.  Psychiatric:        Attention and Perception: Attention normal.        Speech: Speech normal.        Behavior: Behavior normal.     ED Results / Procedures / Treatments   Labs (all labs ordered are listed, but only abnormal results are displayed) Labs Reviewed - No data to display  EKG None  Radiology No results found.  Procedures Procedures    Medications Ordered in ED Medications - No data to display  ED Course/ Medical Decision Making/ A&P                                  Medical Decision Making Amount and/or Complexity of Data Reviewed Radiology: ordered.   Cxr neg Will give referral to ophthalmology Prescribe inhaler for cough        Final Clinical Impression(s) / ED Diagnoses Final diagnoses:  None    Rx / DC Orders ED Discharge Orders     None         Lorre Nick, MD 10/01/23 1059

## 2024-07-07 ENCOUNTER — Other Ambulatory Visit: Payer: Self-pay

## 2024-07-07 ENCOUNTER — Emergency Department (HOSPITAL_BASED_OUTPATIENT_CLINIC_OR_DEPARTMENT_OTHER)
Admission: EM | Admit: 2024-07-07 | Discharge: 2024-07-07 | Disposition: A | Discharge status: Home / Self Care | Attending: Emergency Medicine | Admitting: Emergency Medicine

## 2024-07-07 ENCOUNTER — Emergency Department (HOSPITAL_BASED_OUTPATIENT_CLINIC_OR_DEPARTMENT_OTHER): Admitting: Radiology

## 2024-07-07 ENCOUNTER — Encounter (HOSPITAL_BASED_OUTPATIENT_CLINIC_OR_DEPARTMENT_OTHER): Payer: Self-pay

## 2024-07-07 DIAGNOSIS — S8992XA Unspecified injury of left lower leg, initial encounter: Secondary | ICD-10-CM

## 2024-07-07 DIAGNOSIS — S80212A Abrasion, left knee, initial encounter: Secondary | ICD-10-CM | POA: Diagnosis not present

## 2024-07-07 DIAGNOSIS — W01198A Fall on same level from slipping, tripping and stumbling with subsequent striking against other object, initial encounter: Secondary | ICD-10-CM | POA: Insufficient documentation

## 2024-07-07 DIAGNOSIS — M25562 Pain in left knee: Secondary | ICD-10-CM | POA: Diagnosis present

## 2024-07-07 NOTE — ED Triage Notes (Signed)
 He tells me that he tripped thereby striking his ant. Left knee two days ago. He has an abrasion at ant. Left knee and c/o persistent soreness of his left knee.

## 2024-07-07 NOTE — Discharge Instructions (Addendum)
 Your knee injury is indicative of potential meniscal injury or possible ligamentous injury and for treatment has been placed in a knee immobilizer and provided with crutches.  Please follow-up with orthopedics for repeat assessment in 1 week's time.  Utilize a knee immobilizer while ambulating.  Recommend rest, ice, elevation of the extremity and NSAIDs for pain control

## 2024-07-07 NOTE — ED Notes (Signed)
 Verbal from the MD... Work excuse for 5 days or cleared by the orthopedic.SABRASABRA

## 2024-07-07 NOTE — ED Provider Notes (Signed)
 [place] EMERGENCY DEPARTMENT AT [facility] Provider Note   CSN: [id number] Arrival date & time: 07/07/24  1033     Patient presents with: Knee Injury   Scott Li is a 38 y.o. male.   HPI   38 year old male presenting to the emergency department with a knee injury.  The patient states that he tripped and suffered a mechanical fall striking his left knee 2 days ago.  He was putting peroxide on an abrasion to the anterior knee.  He has had pain and swelling of the left knee.  He has been able to bear weight on the left lower extremity.  He denies any other injuries or complaints.  He states that on range of motion he hears a popping clicking sound.  Prior to Admission medications   Medication Sig Start Date End Date Taking? Authorizing Provider  multivitamin (ONE-A-DAY MEN'S) TABS tablet Take 1 tablet by mouth daily.    [provider]  ondansetron  (ZOFRAN -ODT) 4 MG disintegrating tablet Take 1 tablet (4 mg total) by mouth every 8 (eight) hours as needed for nausea or vomiting. 12/20/22   Stuart Vernell Norris, PA-C  oseltamivir  (TAMIFLU ) 75 MG capsule Take 1 capsule (75 mg total) by mouth every 12 (twelve) hours. 12/20/22   Stuart Vernell Norris, PA-C  promethazine -dextromethorphan (PROMETHAZINE -DM) 6.25-15 MG/5ML syrup Take 5 mLs by mouth 4 (four) times daily as needed. 12/20/22   Stuart Vernell Norris, PA-C    Allergies: Patient has no known allergies.    Review of Systems  All other systems reviewed and are negative.   Updated Vital Signs BP 121/79 (BP Location: Right Arm)   Pulse 88   Temp 98 F (36.7 C)   Resp 16   SpO2 97%   Physical Exam Vitals and nursing note reviewed.  Constitutional:      General: He is not in acute distress. HENT:     Head: Normocephalic and atraumatic.  Eyes:     Conjunctiva/sclera: Conjunctivae normal.     Pupils: Pupils are equal, round, and reactive to light.  Cardiovascular:     Rate and Rhythm: Normal rate and  regular rhythm.  Pulmonary:     Effort: Pulmonary effort is normal. No respiratory distress.  Abdominal:     General: There is no distension.     Tenderness: There is no guarding.  Musculoskeletal:        General: Swelling present. No deformity or signs of injury.     Cervical back: Neck supple.     Comments: Abrasion to the anterior knee, negative anterior and posterior drawer test, no joint laxity on valgus or varus stress, able to extend the lower leg, patella appears to be in place. Distal pulses intact.  Skin:    Findings: No lesion or rash.  Neurological:     General: No focal deficit present.     Mental Status: He is alert. Mental status is at baseline.     (all labs ordered are listed, but only abnormal results are displayed) Labs Reviewed - No data to display  EKG: None  Radiology: DG Knee Complete 4 Views Left Result Date: 07/07/2024 CLINICAL DATA:  fall EXAM: LEFT KNEE - COMPLETE 4+ VIEW COMPARISON:  None Available. FINDINGS: No acute fracture or dislocation. Joint spaces and alignment are maintained. Enthesophyte of the inferior patella. No area of erosion or osseous destruction. No unexpected radiopaque foreign body. Mild soft tissue edema of the anterior knee. No joint effusion. IMPRESSION: 1. No acute fracture or dislocation.  2. Mild soft tissue edema of the anterior knee. Electronically Signed   By: Corean Salter M.D.   On: 07/07/2024 11:15     Procedures   Medications Ordered in the ED - No data to display                                  Medical Decision Making Amount and/or Complexity of Data Reviewed Radiology: ordered.    38 year old male presenting to the emergency department with a knee injury.  The patient states that he tripped and suffered a mechanical fall striking his left knee 2 days ago.  He was putting peroxide on an abrasion to the anterior knee.  He has had pain and swelling of the left knee.  He has been able to bear weight on the left  lower extremity.  He denies any other injuries or complaints.  He states that on range of motion he hears a popping clicking sound.  On arrival, the patient was afebrile, tachycardic initially heart rate 104, not tachypneic, vitally stable.  On exam the patient had a left knee joint with an abrasion superficially, patella appears to be in place, negative anterior and posterior drawer, able to extend the knee and distally pulses are intact.  Considered meniscal injury given the patient's ongoing pain and clicking or popping sensation on range of motion.  The patient was placed in a knee immobilizer and administered crutches.  X-ray imaging was obtained which revealed no fracture or dislocation with mild soft tissue edema of the anterior knee noted.  Patient was provided with an ambulatory referral to orthopedics, plan for close outpatient follow-up for reassessment in 1 week, advised rest, ice, elevation of the extremity and NSAIDs for pain control.  Work note was provided.     Final diagnoses:  Injury of left knee, initial encounter    ED Discharge Orders          Ordered    AMB referral to orthopedics        07/07/24 1202               Jerrol Agent, MD 07/07/24 1646

## 2024-07-07 NOTE — ED Notes (Signed)
 DC paperwork given and verbally understood.

## 2024-10-22 ENCOUNTER — Emergency Department (HOSPITAL_BASED_OUTPATIENT_CLINIC_OR_DEPARTMENT_OTHER)

## 2024-10-22 ENCOUNTER — Other Ambulatory Visit: Payer: Self-pay

## 2024-10-22 ENCOUNTER — Emergency Department (HOSPITAL_BASED_OUTPATIENT_CLINIC_OR_DEPARTMENT_OTHER)
Admission: EM | Admit: 2024-10-22 | Discharge: 2024-10-22 | Disposition: A | Discharge status: Home / Self Care | Attending: Emergency Medicine | Admitting: Emergency Medicine

## 2024-10-22 ENCOUNTER — Encounter (HOSPITAL_BASED_OUTPATIENT_CLINIC_OR_DEPARTMENT_OTHER): Payer: Self-pay | Admitting: Emergency Medicine

## 2024-10-22 ENCOUNTER — Other Ambulatory Visit (HOSPITAL_BASED_OUTPATIENT_CLINIC_OR_DEPARTMENT_OTHER): Payer: Self-pay

## 2024-10-22 DIAGNOSIS — J40 Bronchitis, not specified as acute or chronic: Secondary | ICD-10-CM | POA: Insufficient documentation

## 2024-10-22 DIAGNOSIS — R059 Cough, unspecified: Secondary | ICD-10-CM | POA: Diagnosis present

## 2024-10-22 DIAGNOSIS — R000 Tachycardia, unspecified: Secondary | ICD-10-CM | POA: Diagnosis not present

## 2024-10-22 DIAGNOSIS — R61 Generalized hyperhidrosis: Secondary | ICD-10-CM | POA: Diagnosis not present

## 2024-10-22 DIAGNOSIS — Z79899 Other long term (current) drug therapy: Secondary | ICD-10-CM | POA: Insufficient documentation

## 2024-10-22 DIAGNOSIS — J101 Influenza due to other identified influenza virus with other respiratory manifestations: Secondary | ICD-10-CM | POA: Insufficient documentation

## 2024-10-22 LAB — RESP PANEL BY RT-PCR (RSV, FLU A&B, COVID)  RVPGX2
Influenza A by PCR: POSITIVE — AB
Influenza B by PCR: NEGATIVE
Resp Syncytial Virus by PCR: NEGATIVE
SARS Coronavirus 2 by RT PCR: NEGATIVE

## 2024-10-22 MED ORDER — PREDNISONE 20 MG PO TABS
40.0000 mg | ORAL_TABLET | Freq: Every day | ORAL | 0 refills | Status: AC
  Filled 2024-10-22: qty 10, 5d supply, fill #0

## 2024-10-22 MED ORDER — ALBUTEROL SULFATE HFA 108 (90 BASE) MCG/ACT IN AERS
2.0000 | INHALATION_SPRAY | RESPIRATORY_TRACT | Status: DC | PRN
  Administered 2024-10-22: 09:00:00 2 via RESPIRATORY_TRACT

## 2024-10-22 MED ORDER — PREDNISONE 20 MG PO TABS: 40.0000 mg | ORAL_TABLET | Freq: Every day | ORAL | 0 refills | Status: AC

## 2024-10-22 MED ORDER — IPRATROPIUM-ALBUTEROL 0.5-2.5 (3) MG/3ML IN SOLN
3.0000 mL | Freq: Once | RESPIRATORY_TRACT | Status: AC
  Administered 2024-10-22: 09:00:00 3 mL via RESPIRATORY_TRACT
  Filled 2024-10-22: qty 3

## 2024-10-22 MED ORDER — LACTATED RINGERS IV BOLUS
1000.0000 mL | Freq: Once | INTRAVENOUS | Status: AC
  Administered 2024-10-22: 10:00:00 1000 mL via INTRAVENOUS

## 2024-10-22 MED ORDER — IBUPROFEN 400 MG PO TABS
400.0000 mg | ORAL_TABLET | Freq: Once | ORAL | Status: AC
  Administered 2024-10-22: 10:00:00 400 mg via ORAL
  Filled 2024-10-22: qty 1

## 2024-10-22 MED ORDER — ALBUTEROL SULFATE (2.5 MG/3ML) 0.083% IN NEBU
3.0000 mL | INHALATION_SOLUTION | RESPIRATORY_TRACT | Status: DC | PRN
  Filled 2024-10-22: qty 3

## 2024-10-22 NOTE — ED Provider Notes (Signed)
 [place] EMERGENCY DEPARTMENT AT [facility] Provider Note   CSN: [id number] Arrival date & time: 10/22/24  0800     Patient presents with: Cough   Scott Li is a 38 y.o. male.   Patient is a healthy 38 year old male with a prior history of bronchitis who is presenting today with a 36-hour history of myalgias, malaise, fever, chills, cough, congestion and shortness of breath.  He has not had any vomiting or diarrhea but has had some nausea.  He has not received a flu shot this year and has no known sick contacts.  The shortness of breath is present all the time but worse with activity.  He denies any chest pain.  The history is provided by the patient and medical records.  Cough      Prior to Admission medications  Medication Sig Start Date End Date Taking? Authorizing Provider  predniSONE  (DELTASONE ) 20 MG tablet Take 2 tablets (40 mg total) by mouth daily. 10/22/24  Yes Rook Maue, [place], MD  multivitamin (ONE-A-DAY MEN'S) TABS tablet Take 1 tablet by mouth daily.    [provider]  ondansetron  (ZOFRAN -ODT) 4 MG disintegrating tablet Take 1 tablet (4 mg total) by mouth every 8 (eight) hours as needed for nausea or vomiting. 12/20/22   Stuart Vernell Norris, PA-C  oseltamivir  (TAMIFLU ) 75 MG capsule Take 1 capsule (75 mg total) by mouth every 12 (twelve) hours. 12/20/22   Stuart Vernell Norris, PA-C  predniSONE  (DELTASONE ) 20 MG tablet Take 2 tablets (40 mg total) by mouth daily. 10/22/24   Doretha Benton, MD  promethazine -dextromethorphan (PROMETHAZINE -DM) 6.25-15 MG/5ML syrup Take 5 mLs by mouth 4 (four) times daily as needed. 12/20/22   Stuart Vernell Norris, PA-C    Allergies: Patient has no known allergies.    Review of Systems  Respiratory:  Positive for cough.     Updated Vital Signs BP (!) 141/93   Pulse (!) 120   Temp (!) 101.6 F (38.7 C) (Oral)   Resp 20   Wt (!) 160.6 kg   SpO2 96%   BMI 43.09 kg/m   Physical Exam Vitals and  nursing note reviewed.  Constitutional:      General: He is not in acute distress.    Appearance: He is well-developed. He is diaphoretic.  HENT:     Head: Normocephalic and atraumatic.  Eyes:     Conjunctiva/sclera: Conjunctivae normal.     Pupils: Pupils are equal, round, and reactive to light.  Cardiovascular:     Rate and Rhythm: Regular rhythm. Tachycardia present.     Heart sounds: No murmur heard. Pulmonary:     Effort: Pulmonary effort is normal. No respiratory distress.     Breath sounds: Wheezing present. No rales.     Comments: Diffuse expiratory wheezes Abdominal:     General: There is no distension.     Palpations: Abdomen is soft.     Tenderness: There is no abdominal tenderness. There is no guarding or rebound.  Musculoskeletal:        General: No tenderness. Normal range of motion.     Cervical back: Normal range of motion and neck supple.  Skin:    General: Skin is warm.     Findings: No erythema or rash.  Neurological:     Mental Status: He is alert and oriented to person, place, and time. Mental status is at baseline.  Psychiatric:        Behavior: Behavior normal.     (all labs ordered are  listed, but only abnormal results are displayed) Labs Reviewed  RESP PANEL BY RT-PCR (RSV, FLU A&B, COVID)  RVPGX2 - Abnormal; Notable for the following components:      Result Value   Influenza A by PCR POSITIVE (*)    All other components within normal limits    EKG: None  Radiology: [facility] Chest Port 1 View Result Date: 10/22/2024 EXAM: 1 VIEW(S) XRAY OF THE CHEST 10/22/2024 08:27:39 AM COMPARISON: 10/01/2023 CLINICAL HISTORY: sob FINDINGS: LUNGS AND PLEURA: No focal pulmonary opacity. No pleural effusion. No pneumothorax. HEART AND MEDIASTINUM: No acute abnormality of the cardiac and mediastinal silhouettes. BONES AND SOFT TISSUES: No acute osseous abnormality. IMPRESSION: 1. No acute process. Electronically signed by: Waddell Calk MD 10/22/2024 08:47 AM EST RP  Workstation: [id number]     Procedures   Medications Ordered in the ED  albuterol  (VENTOLIN  HFA) 108 (90 Base) MCG/ACT inhaler 2 puff (2 puffs Inhalation Provided for home use 10/22/24 0844)  ipratropium-albuterol  (DUONEB) 0.5-2.5 (3) MG/3ML nebulizer solution 3 mL (3 mLs Nebulization Given 10/22/24 0836)  lactated ringers  bolus 1,000 mL (0 mLs Intravenous Stopped 10/22/24 1108)  ibuprofen  (ADVIL ) tablet 400 mg (400 mg Oral Given 10/22/24 9057)                                    Medical Decision Making Amount and/or Complexity of Data Reviewed Labs: ordered. Decision-making details documented in ED Course. Radiology: ordered and independent interpretation performed. Decision-making details documented in ED Course.  Risk Prescription drug management.   Pt with symptoms consistent with influenza versus other viral illness versus pneumonia versus bronchitis.  Patient is febrile here, diaphoretic and mildly tachycardic.  He is wheezing in all lung fields.  He does not have a tobacco history denies a history of asthma but reports when he gets sick he does usually need an inhaler.  No signs of strep pharyngitis, otitis or abnormal abdominal findings.   CXR wnl and viral panel are positive for flu A.  Patient given a DuoNeb and will reassess.   2:22 PM After DuoNeb patient's wheezing has improved but he remains tachycardic.  He was given another dose of antipyretic and will give some IV fluids.  Some of his tachycardia is most likely related to fever also to albuterol  but suspect some dehydration as well. Will continue antipyretica and rest and fluids and return for any further problems.      Final diagnoses:  Influenza A  Bronchitis    ED Discharge Orders          Ordered    predniSONE  (DELTASONE ) 20 MG tablet  Daily        10/22/24 1040               Doretha Folks, MD 10/22/24 1422

## 2024-10-22 NOTE — ED Notes (Signed)
 Pt d/c instructions, medications, and follow-up care reviewed with pt. Pt verbalized understanding and had no further questions at time of d/c. Pt CA&Ox4, ambulatory, and in NAD at time of d/c

## 2024-10-22 NOTE — ED Triage Notes (Signed)
 Pt c/o cough, shob, sweating that started yesterday. Pt endorses concern for covid or PNA. Tylenol 1 g today pta

## 2024-10-22 NOTE — Discharge Instructions (Addendum)
 Use the asthma pump every 4-6 hours 1 to 2 puffs as needed for wheezing and shortness of breath.  You were given a prescription for a steroid that you can fill and use if it seems like the flu is getting better but the breathing stuff is not.  If your symptoms start getting a lot worse or you just cannot catch your breath return to the emergency room however here today your oxygen levels look good and there is no sign of pneumonia.  Make sure you are taking Tylenol and ibuprofen  to keep your fever controlled getting lots of rest and drinking plenty of fluid.
